# Patient Record
Sex: Female | Born: 1970 | Race: White | Hispanic: No | Marital: Single | Smoking: Never smoker
Health system: Southern US, Community
[De-identification: ages and names within clinical notes are randomized; demographics above are authoritative.]

## PROBLEM LIST (undated history)

## (undated) DIAGNOSIS — E119 Type 2 diabetes mellitus without complications: Secondary | ICD-10-CM

---

## 2020-04-13 ENCOUNTER — Emergency Department: Payer: Medicaid Other

## 2020-04-13 ENCOUNTER — Other Ambulatory Visit: Payer: Self-pay

## 2020-04-13 ENCOUNTER — Inpatient Hospital Stay: Payer: Medicaid Other

## 2020-04-13 ENCOUNTER — Inpatient Hospital Stay
Admission: EM | Admit: 2020-04-13 | Discharge: 2020-04-21 | DRG: 870 | Disposition: A | Discharge status: Home / Self Care | Payer: Medicaid Other | Attending: Internal Medicine | Admitting: Internal Medicine

## 2020-04-13 ENCOUNTER — Encounter: Payer: Self-pay | Admitting: Emergency Medicine

## 2020-04-13 DIAGNOSIS — J189 Pneumonia, unspecified organism: Secondary | ICD-10-CM | POA: Diagnosis present

## 2020-04-13 DIAGNOSIS — E872 Acidosis: Secondary | ICD-10-CM | POA: Diagnosis not present

## 2020-04-13 DIAGNOSIS — Z6841 Body Mass Index (BMI) 40.0 and over, adult: Secondary | ICD-10-CM | POA: Diagnosis not present

## 2020-04-13 DIAGNOSIS — R9389 Abnormal findings on diagnostic imaging of other specified body structures: Secondary | ICD-10-CM

## 2020-04-13 DIAGNOSIS — J9602 Acute respiratory failure with hypercapnia: Secondary | ICD-10-CM | POA: Diagnosis present

## 2020-04-13 DIAGNOSIS — Z452 Encounter for adjustment and management of vascular access device: Secondary | ICD-10-CM

## 2020-04-13 DIAGNOSIS — F329 Major depressive disorder, single episode, unspecified: Secondary | ICD-10-CM | POA: Diagnosis present

## 2020-04-13 DIAGNOSIS — E0811 Diabetes mellitus due to underlying condition with ketoacidosis with coma: Secondary | ICD-10-CM

## 2020-04-13 DIAGNOSIS — G039 Meningitis, unspecified: Secondary | ICD-10-CM

## 2020-04-13 DIAGNOSIS — E1111 Type 2 diabetes mellitus with ketoacidosis with coma: Secondary | ICD-10-CM | POA: Diagnosis present

## 2020-04-13 DIAGNOSIS — R6521 Severe sepsis with septic shock: Secondary | ICD-10-CM | POA: Diagnosis not present

## 2020-04-13 DIAGNOSIS — Z23 Encounter for immunization: Secondary | ICD-10-CM | POA: Diagnosis not present

## 2020-04-13 DIAGNOSIS — A419 Sepsis, unspecified organism: Secondary | ICD-10-CM

## 2020-04-13 DIAGNOSIS — Z20822 Contact with and (suspected) exposure to covid-19: Secondary | ICD-10-CM | POA: Diagnosis present

## 2020-04-13 DIAGNOSIS — G43909 Migraine, unspecified, not intractable, without status migrainosus: Secondary | ICD-10-CM | POA: Diagnosis present

## 2020-04-13 DIAGNOSIS — E662 Morbid (severe) obesity with alveolar hypoventilation: Secondary | ICD-10-CM | POA: Diagnosis present

## 2020-04-13 DIAGNOSIS — N179 Acute kidney failure, unspecified: Secondary | ICD-10-CM | POA: Diagnosis present

## 2020-04-13 DIAGNOSIS — E785 Hyperlipidemia, unspecified: Secondary | ICD-10-CM | POA: Diagnosis present

## 2020-04-13 DIAGNOSIS — J9601 Acute respiratory failure with hypoxia: Secondary | ICD-10-CM | POA: Diagnosis present

## 2020-04-13 DIAGNOSIS — I5021 Acute systolic (congestive) heart failure: Secondary | ICD-10-CM | POA: Diagnosis present

## 2020-04-13 DIAGNOSIS — G92 Toxic encephalopathy: Secondary | ICD-10-CM | POA: Diagnosis present

## 2020-04-13 HISTORY — DX: Type 2 diabetes mellitus without complications: E11.9

## 2020-04-13 LAB — SALICYLATE LEVEL: Salicylate Lvl: <7 mg/dL — ABNORMAL LOW (ref 7.0–30.0)

## 2020-04-13 LAB — URINE DRUG SCREEN, QUALITATIVE (ARMC ONLY)
Amphetamines, Ur Screen: NOT DETECTED
Barbiturates, Ur Screen: NOT DETECTED
Benzodiazepine, Ur Scrn: NOT DETECTED
Cannabinoid 50 Ng, Ur ~~LOC~~: NOT DETECTED
Cocaine Metabolite,Ur ~~LOC~~: NOT DETECTED
MDMA (Ecstasy)Ur Screen: NOT DETECTED
Methadone Scn, Ur: NOT DETECTED
Opiate, Ur Screen: NOT DETECTED
Phencyclidine (PCP) Ur S: NOT DETECTED
Tricyclic, Ur Screen: NOT DETECTED

## 2020-04-13 LAB — BLOOD GAS, ARTERIAL
Acid-base deficit: 10.2 mmol/L — ABNORMAL HIGH (ref 0.0–2.0)
Bicarbonate: 16.8 mmol/L — ABNORMAL LOW (ref 20.0–28.0)
FIO2: 0.3
MECHVT: 500 mL
O2 Saturation: 94.2 %
PEEP: 5 cmH2O
Patient temperature: 37.9
RATE: 16 resp/min
pCO2 arterial: 42 mmHg (ref 32.0–48.0)
pH, Arterial: 7.22 — ABNORMAL LOW (ref 7.350–7.450)
pO2, Arterial: 90 mmHg (ref 83.0–108.0)

## 2020-04-13 LAB — URINALYSIS, COMPLETE (UACMP) WITH MICROSCOPIC
Bacteria, UA: NONE SEEN
Bilirubin Urine: NEGATIVE
Glucose, UA: >=500 mg/dL — AB
Ketones, ur: 80 mg/dL — AB
Leukocytes,Ua: NEGATIVE
Nitrite: NEGATIVE
Protein, ur: >=300 mg/dL — AB
Specific Gravity, Urine: 1.024 (ref 1.005–1.030)
pH: 5 (ref 5.0–8.0)

## 2020-04-13 LAB — CBC WITH DIFFERENTIAL/PLATELET
Abs Immature Granulocytes: 0.13 10*3/uL — ABNORMAL HIGH (ref 0.00–0.07)
Basophils Absolute: 0.1 10*3/uL (ref 0.0–0.1)
Basophils Relative: 1 %
Eosinophils Absolute: 0 10*3/uL (ref 0.0–0.5)
Eosinophils Relative: 0 %
HCT: 38.6 % (ref 36.0–46.0)
Hemoglobin: 12.9 g/dL (ref 12.0–15.0)
Immature Granulocytes: 1 %
Lymphocytes Relative: 7 %
Lymphs Abs: 1.4 10*3/uL (ref 0.7–4.0)
MCH: 31.2 pg (ref 26.0–34.0)
MCHC: 33.4 g/dL (ref 30.0–36.0)
MCV: 93.2 fL (ref 80.0–100.0)
Monocytes Absolute: 0.8 10*3/uL (ref 0.1–1.0)
Monocytes Relative: 4 %
Neutro Abs: 18.2 10*3/uL — ABNORMAL HIGH (ref 1.7–7.7)
Neutrophils Relative %: 87 %
Platelets: 358 10*3/uL (ref 150–400)
RBC: 4.14 MIL/uL (ref 3.87–5.11)
RDW: 12.3 % (ref 11.5–15.5)
WBC: 20.7 10*3/uL — ABNORMAL HIGH (ref 4.0–10.5)
nRBC: 0 % (ref 0.0–0.2)

## 2020-04-13 LAB — COMPREHENSIVE METABOLIC PANEL
ALT: 17 U/L (ref 0–44)
AST: 19 U/L (ref 15–41)
Albumin: 3.4 g/dL — ABNORMAL LOW (ref 3.5–5.0)
Alkaline Phosphatase: 72 U/L (ref 38–126)
Anion gap: 20 — ABNORMAL HIGH (ref 5–15)
BUN: 32 mg/dL — ABNORMAL HIGH (ref 6–20)
CO2: 21 mmol/L — ABNORMAL LOW (ref 22–32)
Calcium: 8.8 mg/dL — ABNORMAL LOW (ref 8.9–10.3)
Chloride: 98 mmol/L (ref 98–111)
Creatinine, Ser: 1.43 mg/dL — ABNORMAL HIGH (ref 0.44–1.00)
GFR calc Af Amer: 50 mL/min — ABNORMAL LOW (ref >60)
GFR calc non Af Amer: 43 mL/min — ABNORMAL LOW (ref >60)
Glucose, Bld: 589 mg/dL (ref 70–99)
Potassium: 5.1 mmol/L (ref 3.5–5.1)
Sodium: 139 mmol/L (ref 135–145)
Total Bilirubin: 2.1 mg/dL — ABNORMAL HIGH (ref 0.3–1.2)
Total Protein: 7.1 g/dL (ref 6.5–8.1)

## 2020-04-13 LAB — BASIC METABOLIC PANEL
Anion gap: 12 (ref 5–15)
Anion gap: 7 (ref 5–15)
BUN: 31 mg/dL — ABNORMAL HIGH (ref 6–20)
BUN: 26 mg/dL — ABNORMAL HIGH (ref 6–20)
CO2: 20 mmol/L — ABNORMAL LOW (ref 22–32)
CO2: 21 mmol/L — ABNORMAL LOW (ref 22–32)
Calcium: 7.7 mg/dL — ABNORMAL LOW (ref 8.9–10.3)
Calcium: 7.3 mg/dL — ABNORMAL LOW (ref 8.9–10.3)
Chloride: 108 mmol/L (ref 98–111)
Chloride: 110 mmol/L (ref 98–111)
Creatinine, Ser: 1.4 mg/dL — ABNORMAL HIGH (ref 0.44–1.00)
Creatinine, Ser: 1.22 mg/dL — ABNORMAL HIGH (ref 0.44–1.00)
GFR calc Af Amer: 51 mL/min — ABNORMAL LOW (ref >60)
GFR calc Af Amer: >60 mL/min (ref >60)
GFR calc non Af Amer: 44 mL/min — ABNORMAL LOW (ref >60)
GFR calc non Af Amer: 52 mL/min — ABNORMAL LOW (ref >60)
Glucose, Bld: 209 mg/dL — ABNORMAL HIGH (ref 70–99)
Glucose, Bld: 797 mg/dL (ref 70–99)
Potassium: 3.8 mmol/L (ref 3.5–5.1)
Potassium: 4.5 mmol/L (ref 3.5–5.1)
Sodium: 140 mmol/L (ref 135–145)
Sodium: 138 mmol/L (ref 135–145)

## 2020-04-13 LAB — GLUCOSE, CAPILLARY
Glucose-Capillary: 560 mg/dL (ref 70–99)
Glucose-Capillary: 551 mg/dL (ref 70–99)
Glucose-Capillary: 476 mg/dL — ABNORMAL HIGH (ref 70–99)
Glucose-Capillary: 354 mg/dL — ABNORMAL HIGH (ref 70–99)
Glucose-Capillary: 440 mg/dL — ABNORMAL HIGH (ref 70–99)
Glucose-Capillary: 127 mg/dL — ABNORMAL HIGH (ref 70–99)
Glucose-Capillary: 149 mg/dL — ABNORMAL HIGH (ref 70–99)
Glucose-Capillary: 133 mg/dL — ABNORMAL HIGH (ref 70–99)
Glucose-Capillary: 157 mg/dL — ABNORMAL HIGH (ref 70–99)
Glucose-Capillary: 162 mg/dL — ABNORMAL HIGH (ref 70–99)
Glucose-Capillary: 151 mg/dL — ABNORMAL HIGH (ref 70–99)
Glucose-Capillary: 144 mg/dL — ABNORMAL HIGH (ref 70–99)
Glucose-Capillary: 148 mg/dL — ABNORMAL HIGH (ref 70–99)
Glucose-Capillary: 174 mg/dL — ABNORMAL HIGH (ref 70–99)

## 2020-04-13 LAB — BLOOD GAS, VENOUS
Acid-base deficit: 6.9 mmol/L — ABNORMAL HIGH (ref 0.0–2.0)
Bicarbonate: 20.6 mmol/L (ref 20.0–28.0)
O2 Saturation: 76.3 %
Patient temperature: 37
pCO2, Ven: 48 mmHg (ref 44.0–60.0)
pH, Ven: 7.24 — ABNORMAL LOW (ref 7.250–7.430)
pO2, Ven: 49 mmHg — ABNORMAL HIGH (ref 32.0–45.0)

## 2020-04-13 LAB — BETA-HYDROXYBUTYRIC ACID: Beta-Hydroxybutyric Acid: 6.27 mmol/L — ABNORMAL HIGH (ref 0.05–0.27)

## 2020-04-13 LAB — ACETAMINOPHEN LEVEL: Acetaminophen (Tylenol), Serum: <10 ug/mL — ABNORMAL LOW (ref 10–30)

## 2020-04-13 LAB — LIPASE, BLOOD: Lipase: 24 U/L (ref 11–51)

## 2020-04-13 LAB — PROCALCITONIN: Procalcitonin: <0.1 ng/mL

## 2020-04-13 LAB — SARS CORONAVIRUS 2 BY RT PCR (HOSPITAL ORDER, PERFORMED IN ~~LOC~~ HOSPITAL LAB): SARS Coronavirus 2: NEGATIVE

## 2020-04-13 LAB — LACTIC ACID, PLASMA
Lactic Acid, Venous: 3.2 mmol/L (ref 0.5–1.9)
Lactic Acid, Venous: 2.4 mmol/L (ref 0.5–1.9)

## 2020-04-13 LAB — PREGNANCY, URINE: Preg Test, Ur: NEGATIVE

## 2020-04-13 LAB — MRSA PCR SCREENING: MRSA by PCR: NEGATIVE

## 2020-04-13 LAB — ETHANOL: Alcohol, Ethyl (B): <10 mg/dL (ref <10)

## 2020-04-13 MED ORDER — DOCUSATE SODIUM 100 MG PO CAPS: 100.0000 mg | ORAL_CAPSULE | Freq: Two times a day (BID) | ORAL | Status: DC | PRN

## 2020-04-13 MED ORDER — KETAMINE HCL 10 MG/ML IJ SOLN
INTRAMUSCULAR | Status: AC | PRN
  Administered 2020-04-13: 200 mg via INTRAVENOUS

## 2020-04-13 MED ORDER — NOREPINEPHRINE 4 MG/250ML-% IV SOLN
2.0000 ug/min | INTRAVENOUS | Status: DC
  Administered 2020-04-14: 5 ug/min via INTRAVENOUS
  Filled 2020-04-13 (×3): qty 250

## 2020-04-13 MED ORDER — PROPOFOL BOLUS VIA INFUSION
50.0000 mg | Freq: Once | INTRAVENOUS | Status: AC
  Administered 2020-04-13: 50 mg via INTRAVENOUS

## 2020-04-13 MED ORDER — SODIUM CHLORIDE 0.9 % IV BOLUS
1000.0000 mL | Freq: Once | INTRAVENOUS | Status: AC
  Administered 2020-04-13: 1000 mL via INTRAVENOUS

## 2020-04-13 MED ORDER — POTASSIUM CHLORIDE 20 MEQ PO PACK
40.0000 meq | PACK | ORAL | Status: AC
  Administered 2020-04-13 (×2): 40 meq
  Filled 2020-04-13 (×2): qty 2

## 2020-04-13 MED ORDER — SODIUM CHLORIDE 0.9 % IV SOLN: INTRAVENOUS | Status: DC

## 2020-04-13 MED ORDER — PROPOFOL 1000 MG/100ML IV EMUL
5.0000 ug/kg/min | INTRAVENOUS | Status: DC
  Administered 2020-04-13: 20 ug/kg/min via INTRAVENOUS
  Administered 2020-04-13: 10 ug/kg/min via INTRAVENOUS
  Administered 2020-04-13: 60 ug/kg/min via INTRAVENOUS
  Administered 2020-04-14: 40 ug/kg/min via INTRAVENOUS
  Administered 2020-04-14: 25 ug/kg/min via INTRAVENOUS
  Administered 2020-04-14 (×2): 30 ug/kg/min via INTRAVENOUS
  Administered 2020-04-14 – 2020-04-15 (×2): 35 ug/kg/min via INTRAVENOUS
  Administered 2020-04-15: 30 ug/kg/min via INTRAVENOUS
  Filled 2020-04-13 (×9): qty 100

## 2020-04-13 MED ORDER — INSULIN REGULAR(HUMAN) IN NACL 100-0.9 UT/100ML-% IV SOLN
INTRAVENOUS | Status: DC
  Administered 2020-04-13: 15 [IU]/h via INTRAVENOUS
  Administered 2020-04-13: 2.2 [IU]/h via INTRAVENOUS
  Filled 2020-04-13 (×2): qty 100

## 2020-04-13 MED ORDER — SODIUM CHLORIDE 0.9 % IV SOLN
2.0000 g | Freq: Two times a day (BID) | INTRAVENOUS | Status: AC
  Administered 2020-04-13 – 2020-04-19 (×13): 2 g via INTRAVENOUS
  Filled 2020-04-13 (×2): qty 20
  Filled 2020-04-13: qty 2
  Filled 2020-04-13 (×2): qty 20
  Filled 2020-04-13 (×3): qty 2
  Filled 2020-04-13: qty 20
  Filled 2020-04-13 (×5): qty 2
  Filled 2020-04-13 (×2): qty 20
  Filled 2020-04-13: qty 2

## 2020-04-13 MED ORDER — LACTATED RINGERS IV BOLUS
1000.0000 mL | Freq: Once | INTRAVENOUS | Status: AC
  Administered 2020-04-13: 1000 mL via INTRAVENOUS

## 2020-04-13 MED ORDER — FENTANYL BOLUS VIA INFUSION
50.0000 ug | INTRAVENOUS | Status: DC | PRN
  Administered 2020-04-15 – 2020-04-17 (×4): 50 ug via INTRAVENOUS
  Filled 2020-04-13: qty 50

## 2020-04-13 MED ORDER — DOCUSATE SODIUM 50 MG/5ML PO LIQD
100.0000 mg | Freq: Two times a day (BID) | ORAL | Status: DC
  Administered 2020-04-13 – 2020-04-19 (×8): 100 mg
  Filled 2020-04-13 (×9): qty 10

## 2020-04-13 MED ORDER — DEXTROSE IN LACTATED RINGERS 5 % IV SOLN: INTRAVENOUS | Status: DC

## 2020-04-13 MED ORDER — HYDROMORPHONE HCL 1 MG/ML IJ SOLN
1.0000 mg | INTRAMUSCULAR | Status: AC
  Administered 2020-04-13: 1 mg via INTRAVENOUS
  Filled 2020-04-13: qty 1

## 2020-04-13 MED ORDER — POLYETHYLENE GLYCOL 3350 17 G PO PACK: 17.0000 g | PACK | Freq: Every day | ORAL | Status: DC | PRN

## 2020-04-13 MED ORDER — VANCOMYCIN HCL IN DEXTROSE 1-5 GM/200ML-% IV SOLN
1000.0000 mg | Freq: Once | INTRAVENOUS | Status: AC
  Administered 2020-04-13: 1000 mg via INTRAVENOUS
  Filled 2020-04-13: qty 200

## 2020-04-13 MED ORDER — ONDANSETRON HCL 4 MG/2ML IJ SOLN: 4.0000 mg | Freq: Once | INTRAMUSCULAR | Status: DC

## 2020-04-13 MED ORDER — FAMOTIDINE IN NACL 20-0.9 MG/50ML-% IV SOLN
20.0000 mg | Freq: Two times a day (BID) | INTRAVENOUS | Status: DC
  Administered 2020-04-13 – 2020-04-17 (×9): 20 mg via INTRAVENOUS
  Filled 2020-04-13 (×9): qty 50

## 2020-04-13 MED ORDER — LORAZEPAM 2 MG/ML IJ SOLN
INTRAMUSCULAR | Status: AC
  Administered 2020-04-13: 4 mg via INTRAVENOUS
  Filled 2020-04-13: qty 2

## 2020-04-13 MED ORDER — PROPOFOL 1000 MG/100ML IV EMUL
5.0000 ug/kg/min | INTRAVENOUS | Status: DC
  Administered 2020-04-13: 10 ug/kg/min via INTRAVENOUS

## 2020-04-13 MED ORDER — ROCURONIUM BROMIDE 50 MG/5ML IV SOLN
INTRAVENOUS | Status: AC | PRN
  Administered 2020-04-13: 100 mg via INTRAVENOUS

## 2020-04-13 MED ORDER — LACTATED RINGERS IV SOLN: INTRAVENOUS | Status: DC

## 2020-04-13 MED ORDER — DEXTROSE 50 % IV SOLN
0.0000 mL | INTRAVENOUS | Status: DC | PRN
  Administered 2020-04-19: 25 mL via INTRAVENOUS
  Filled 2020-04-13 (×2): qty 50

## 2020-04-13 MED ORDER — ACETAMINOPHEN 325 MG PO TABS: 650.0000 mg | ORAL_TABLET | ORAL | Status: DC | PRN

## 2020-04-13 MED ORDER — CHLORHEXIDINE GLUCONATE 0.12% ORAL RINSE (MEDLINE KIT)
15.0000 mL | Freq: Two times a day (BID) | OROMUCOSAL | Status: DC
  Administered 2020-04-13 – 2020-04-17 (×8): 15 mL via OROMUCOSAL

## 2020-04-13 MED ORDER — ORAL CARE MOUTH RINSE
15.0000 mL | OROMUCOSAL | Status: DC
  Administered 2020-04-13 – 2020-04-17 (×38): 15 mL via OROMUCOSAL

## 2020-04-13 MED ORDER — ONDANSETRON HCL 4 MG/2ML IJ SOLN: 4.0000 mg | Freq: Four times a day (QID) | INTRAMUSCULAR | Status: DC | PRN

## 2020-04-13 MED ORDER — DOCUSATE SODIUM 50 MG/5ML PO LIQD: 100.0000 mg | Freq: Two times a day (BID) | ORAL | Status: DC

## 2020-04-13 MED ORDER — FENTANYL 2500MCG IN NS 250ML (10MCG/ML) PREMIX INFUSION
0.0000 ug/h | INTRAVENOUS | Status: DC
  Administered 2020-04-13: 50 ug/h via INTRAVENOUS
  Administered 2020-04-14 – 2020-04-15 (×3): 200 ug/h via INTRAVENOUS
  Administered 2020-04-15: 150 ug/h via INTRAVENOUS
  Administered 2020-04-16: 175 ug/h via INTRAVENOUS
  Administered 2020-04-16: 300 ug/h via INTRAVENOUS
  Administered 2020-04-16: 325 ug/h via INTRAVENOUS
  Administered 2020-04-17: 350 ug/h via INTRAVENOUS
  Filled 2020-04-13 (×8): qty 250

## 2020-04-13 MED ORDER — PROPOFOL 1000 MG/100ML IV EMUL
INTRAVENOUS | Status: AC
  Filled 2020-04-13: qty 100

## 2020-04-13 MED ORDER — POLYETHYLENE GLYCOL 3350 17 G PO PACK
17.0000 g | PACK | Freq: Every day | ORAL | Status: DC
  Administered 2020-04-15 – 2020-04-17 (×3): 17 g
  Filled 2020-04-13 (×3): qty 1

## 2020-04-13 MED ORDER — SODIUM CHLORIDE 0.9% FLUSH
3.0000 mL | Freq: Two times a day (BID) | INTRAVENOUS | Status: DC
  Administered 2020-04-13 – 2020-04-21 (×15): 3 mL via INTRAVENOUS

## 2020-04-13 MED ORDER — ACETAMINOPHEN 325 MG PO TABS
650.0000 mg | ORAL_TABLET | ORAL | Status: DC | PRN
  Administered 2020-04-13 – 2020-04-19 (×2): 650 mg
  Filled 2020-04-13 (×2): qty 2

## 2020-04-13 MED ORDER — DEXTROSE 5 % IV SOLN
10.0000 mg/kg | Freq: Once | INTRAVENOUS | Status: AC
  Administered 2020-04-14: 825 mg via INTRAVENOUS
  Filled 2020-04-13: qty 16.5

## 2020-04-13 MED ORDER — FENTANYL CITRATE (PF) 100 MCG/2ML IJ SOLN: 50.0000 ug | Freq: Once | INTRAMUSCULAR | Status: DC

## 2020-04-13 MED ORDER — CHLORHEXIDINE GLUCONATE CLOTH 2 % EX PADS
6.0000 | MEDICATED_PAD | Freq: Every day | CUTANEOUS | Status: DC
  Administered 2020-04-14 – 2020-04-19 (×6): 6 via TOPICAL

## 2020-04-13 MED ORDER — SODIUM CHLORIDE 0.9 % IV SOLN
250.0000 mL | INTRAVENOUS | Status: DC | PRN
  Administered 2020-04-13 – 2020-04-20 (×5): 250 mL via INTRAVENOUS

## 2020-04-13 MED ORDER — DEXTROSE-NACL 5-0.45 % IV SOLN: INTRAVENOUS | Status: DC

## 2020-04-13 MED ORDER — SODIUM CHLORIDE 0.9% FLUSH: 3.0000 mL | INTRAVENOUS | Status: DC | PRN

## 2020-04-13 MED ORDER — VANCOMYCIN HCL 10 G IV SOLR
2000.0000 mg | Freq: Once | INTRAVENOUS | Status: AC
  Administered 2020-04-14: 2000 mg via INTRAVENOUS
  Filled 2020-04-13: qty 2000

## 2020-04-13 MED ORDER — SODIUM CHLORIDE 0.9 % IV SOLN
2.0000 g | Freq: Once | INTRAVENOUS | Status: AC
  Administered 2020-04-13: 2 g via INTRAVENOUS
  Filled 2020-04-13: qty 2

## 2020-04-13 MED ORDER — MIDAZOLAM HCL 2 MG/2ML IJ SOLN
2.0000 mg | INTRAMUSCULAR | Status: AC | PRN
  Administered 2020-04-15 – 2020-04-16 (×2): 2 mg via INTRAVENOUS
  Filled 2020-04-13 (×9): qty 2

## 2020-04-13 MED ORDER — FENTANYL 2500MCG IN NS 250ML (10MCG/ML) PREMIX INFUSION
0.0000 ug/h | INTRAVENOUS | Status: DC
  Filled 2020-04-13: qty 250

## 2020-04-13 MED ORDER — DEXAMETHASONE SODIUM PHOSPHATE 10 MG/ML IJ SOLN
15.0000 mg | Freq: Four times a day (QID) | INTRAMUSCULAR | Status: AC
  Administered 2020-04-13 – 2020-04-15 (×8): 15 mg via INTRAVENOUS
  Filled 2020-04-13 (×9): qty 1.5

## 2020-04-13 MED ORDER — POLYETHYLENE GLYCOL 3350 17 G PO PACK: 17.0000 g | PACK | Freq: Every day | ORAL | Status: DC

## 2020-04-13 MED ORDER — LORAZEPAM 2 MG/ML IJ SOLN
4.0000 mg | Freq: Once | INTRAMUSCULAR | Status: DC
  Filled 2020-04-13: qty 2

## 2020-04-13 MED ORDER — LACTATED RINGERS IV BOLUS
2000.0000 mL | Freq: Once | INTRAVENOUS | Status: AC
  Administered 2020-04-13: 2000 mL via INTRAVENOUS

## 2020-04-13 MED ORDER — KETAMINE HCL 10 MG/ML IJ SOLN
0.3000 mg/kg | Freq: Once | INTRAMUSCULAR | Status: AC
  Administered 2020-04-13: 35 mg via INTRAVENOUS

## 2020-04-13 MED ORDER — MIDAZOLAM HCL 2 MG/2ML IJ SOLN
2.0000 mg | INTRAMUSCULAR | Status: DC | PRN
  Administered 2020-04-15 – 2020-04-16 (×8): 2 mg via INTRAVENOUS
  Filled 2020-04-13 (×5): qty 2

## 2020-04-13 MED ORDER — LORAZEPAM 2 MG/ML IJ SOLN: 4.0000 mg | Freq: Once | INTRAMUSCULAR | Status: AC

## 2020-04-13 MED ORDER — MIDAZOLAM HCL 2 MG/2ML IJ SOLN
INTRAMUSCULAR | Status: AC
  Administered 2020-04-13: 2 mg via INTRAVENOUS
  Filled 2020-04-13: qty 2

## 2020-04-13 MED ORDER — ENOXAPARIN SODIUM 40 MG/0.4ML ~~LOC~~ SOLN
40.0000 mg | Freq: Every day | SUBCUTANEOUS | Status: DC
  Administered 2020-04-13: 40 mg via SUBCUTANEOUS
  Filled 2020-04-13: qty 0.4

## 2020-04-13 MED ORDER — KETAMINE HCL 10 MG/ML IJ SOLN
INTRAMUSCULAR | Status: AC
  Filled 2020-04-13: qty 1

## 2020-04-13 MED ORDER — FAMOTIDINE IN NACL 20-0.9 MG/50ML-% IV SOLN: 20.0000 mg | Freq: Two times a day (BID) | INTRAVENOUS | Status: DC

## 2020-04-13 MED ORDER — ENOXAPARIN SODIUM 40 MG/0.4ML ~~LOC~~ SOLN: 40.0000 mg | SUBCUTANEOUS | Status: DC

## 2020-04-13 MED ORDER — METRONIDAZOLE IN NACL 5-0.79 MG/ML-% IV SOLN
500.0000 mg | Freq: Once | INTRAVENOUS | Status: AC
  Administered 2020-04-13: 500 mg via INTRAVENOUS
  Filled 2020-04-13: qty 100

## 2020-04-13 MED ORDER — SODIUM CHLORIDE 0.9 % IV SOLN
250.0000 mL | INTRAVENOUS | Status: DC
  Administered 2020-04-18 – 2020-04-19 (×3): 250 mL via INTRAVENOUS

## 2020-04-13 NOTE — Procedures (Signed)
ELECTROENCEPHALOGRAM REPORT   Patient: Jessica Santiago       Room #: IC14A-AA EEG No. ID: [id number] Age: 49 y.o.        Sex: female Requesting Physician: Kasa Report Date:  04/13/2020        Interpreting Physician: Thana Farr  History: [name] is an 49 y.o. female with DKA and AMS  Medications:  Fentanyl, Propofol, Insulin  Conditions of Recording:  This is a 21 channel routine scalp EEG performed with bipolar and monopolar montages arranged in accordance to the international 10/20 system of electrode placement. One channel was dedicated to EKG recording.  The patient is in the intubated and sedated state.  Patient is positioned with her head to the left.  Description: Artifact is prominent during the recording.  The background activity is discontinuous. It consists of bursts of polyspike, spike and slow wave activity alternating with periods of attenuation. The burst activity lasts up to 3 seconds and is generalized for the most part although there are some occasions when this burst activity is more prominent from the left hemisphere.  The periods of attenuation last up to 12 seconds. This discontinuous activity is maintained throughout the tracing.  No activation procedures were performed   IMPRESSION: This is an abnormal EEG due to a burst-suppression pattern seen throughout the tracing.  Burst suppression pattern can be seen in a variety of circumstances, including anesthesia, drug intoxication, hypothermia, as well as cerebral anoxia.  Clinical/neurological, and radiographic correlation advised.     Thana Farr, MD Neurology [phone] 04/13/2020, 3:19 PM

## 2020-04-13 NOTE — Progress Notes (Signed)
STAT eeg completed

## 2020-04-13 NOTE — ED Notes (Signed)
2 sets of blood cultures sent to lab.

## 2020-04-13 NOTE — Progress Notes (Signed)
BP in 90s. Another bolus ordered and started.

## 2020-04-13 NOTE — ED Triage Notes (Signed)
Pt presents from home via [facility] c/o altered mental status. pt blood sugar reading high initially after fluid 480. boyfriend found pt on floor covered in vomit and feces, pt aggresive with ems. Pt has hx of diabtes. Pt currently agitated and non-verbal. Pt maintaining airway at this time.

## 2020-04-13 NOTE — ED Notes (Signed)
ET tube 21 cm at lip, positive color change, positive breath sounds bilaterally.

## 2020-04-13 NOTE — H&P (Addendum)
Name: Jessica Santiago MRN: [id number] DOB: 1971/09/16     CONSULTATION DATE: 04/13/2020  REFERRING MD :  Scotty Court  CHIEF COMPLAINT:  UNREPSONSIVENSS  HISTORY OF PRESENT ILLNESS:    49 y.o. female with a history of diabetes who is brought to the ED due to altered mental status.    She was found on the floor at home covered in feces urine and vomit by her boyfriend.    EMS report the patient is confused, agitated, unable to provide history or engage in medical care.  Blood sugar on scene read as "high."  Patient with acute resp failure Acute toxic metabolic encephalopathy Intubated for severe resp failure  Patient arrived to ICU with severe resp failure and postural decortication Patient already on Propofol and 4 Ativan given  Patient is critically ill and multiorgan failure    PAST MEDICAL HISTORY :   has a past medical history of Diabetes mellitus without complication (HCC).  has no past surgical history on file. Prior to Admission medications   Not on File   Not on File  FAMILY HISTORY:  family history is not on file. SOCIAL HISTORY:    REVIEW OF SYSTEMS:   Unable to obtain due to critical illness      There is no height or weight on file to calculate BMI.    VITAL SIGNS: Temp:  [95.5 F (35.3 C)-100.3 F (37.9 C)] 99.7 F (37.6 C) (05/13 1200) Pulse Rate:  [130-152] 133 (05/13 1200) Resp:  [17-26] 17 (05/13 1115) BP: (110-160)/(59-100) 122/79 (05/13 1200) SpO2:  [97 %-100 %] 99 % (05/13 1200) FiO2 (%):  [30 %] 30 % (05/13 1029) Weight:  [115.2 kg] 115.2 kg (05/13 0932)   No intake/output data recorded. No intake/output data recorded.   SpO2: 99 % FiO2 (%): 30 %   Physical Examination:  GENERAL:critically ill appearing, +resp distress HEAD: Normocephalic, atraumatic.  EYES: Pupils equal, round, reactive to light.  No scleral icterus.  MOUTH: Moist mucosal membrane. NECK: Supple. No JVD.  PULMONARY: +rhonchi, +wheezing CARDIOVASCULAR:  S1 and S2. Regular rate and rhythm. No murmurs, rubs, or gallops.  GASTROINTESTINAL: Soft, nontender, -distended.  Positive bowel sounds.  MUSCULOSKELETAL: No swelling, clubbing, or edema.  NEUROLOGIC: obtunded SKIN:intact,warm,dry  I personally reviewed lab work that was obtained in last 24 hrs. CXR Independently reviewed-no acute pneumonia  MEDICATIONS: I have reviewed all medications and confirmed regimen as documented   CULTURE RESULTS   Recent Results (from the past 240 hour(s))  SARS Coronavirus 2 by RT PCR (hospital order, performed in [facility] hospital lab) Nasopharyngeal Nasopharyngeal Swab     Status: None   Collection Time: 04/13/20  9:17 AM   Specimen: Nasopharyngeal Swab  Result Value Ref Range Status   SARS Coronavirus 2 NEGATIVE NEGATIVE Final    Comment: (NOTE) SARS-CoV-2 target nucleic acids are NOT DETECTED. The SARS-CoV-2 RNA is generally detectable in upper and lower respiratory specimens during the acute phase of infection. The lowest concentration of SARS-CoV-2 viral copies this assay can detect is 250 copies / mL. A negative result does not preclude SARS-CoV-2 infection and should not be used as the sole basis for treatment or other patient management decisions.  A negative result may occur with improper specimen collection / handling, submission of specimen other than nasopharyngeal swab, presence of viral mutation(s) within the areas targeted by this assay, and inadequate number of viral copies (<250 copies / mL). A negative result must be combined with clinical observations, patient history, and epidemiological  information. Fact Sheet for Patients:   StrictlyIdeas.no Fact Sheet for Healthcare Providers: BankingDealers.co.za This test is not yet approved or cleared  by the [place] FDA and has been authorized for detection and/or diagnosis of SARS-CoV-2 by FDA under an Emergency Use Authorization  (EUA).  This EUA will remain in effect (meaning this test can be used) for the duration of the COVID-19 declaration under Section 564(b)(1) of the Act, 21 U.S.C. section 360bbb-3(b)(1), unless the authorization is terminated or revoked sooner. Performed at [facility], [place], [place] [address]           IMAGING    DG Abdomen 1 View  Result Date: 04/13/2020 CLINICAL DATA:  Orogastric tube placement EXAM: ABDOMEN - 1 VIEW COMPARISON:  None. FINDINGS: Orogastric tube tip and side port in stomach. Moderate stool in colon. No bowel dilatation or air-fluid level to suggest bowel obstruction. No free air. IMPRESSION: Orogastric tube tip and side port in stomach. No bowel obstruction or free air evident on supine examination. Electronically Signed   By: Lowella Grip III M.D.   On: 04/13/2020 11:12   DG Chest Portable 1 View  Result Date: 04/13/2020 CLINICAL DATA:  Hypoxia and confusion EXAM: PORTABLE CHEST 1 VIEW COMPARISON:  None. FINDINGS: Endotracheal tube tip is 3.2 cm above the carina. Nasogastric tube tip and side port are below the diaphragm. Side port is seen in the stomach. No pneumothorax. There is left base atelectasis. Lungs elsewhere clear. Heart is slightly enlarged with pulmonary vascularity normal. No adenopathy. No bone lesions. IMPRESSION: Tube positions as described without pneumothorax. Left base atelectasis. Lungs elsewhere clear. Heart prominent. Electronically Signed   By: Lowella Grip III M.D.   On: 04/13/2020 11:10     Nutrition Status:           Indwelling Urinary Catheter continued, requirement due to   Reason to continue Indwelling Urinary Catheter strict Intake/Output monitoring for hemodynamic instability         Ventilator continued, requirement due to severe respiratory failure   Ventilator Sedation RASS 0 to -2      ASSESSMENT AND PLAN SYNOPSIS   Severe ACUTE Hypoxic and Hypercapnic Respiratory Failure  due severe acidosis due to DKA with toxic metabolic encephalopathy -continue Full MV support -continue Bronchodilator Therapy -Wean Fio2 and PEEP as tolerated -VAP/VENT bundle implementation   Morbid obesity, possible OSA.   Will certainly impact respiratory mechanics, ventilator weaning Suspect will need to consider additional PEEP   ACUTE KIDNEY INJURY/Renal Failure -continue Foley Catheter-assess need -Avoid nephrotoxic agents -Follow urine output, BMP -Ensure adequate renal perfusion, optimize oxygenation -Renal dose medications     NEUROLOGY-assess for CVA and Seziures - intubated and sedated - minimal sedation to achieve a RASS goal: -1 Obtain CT head STAT Obtain EEG Neurology consultation pending  Acute toxic metabolic encephalopathy, need for sedation Goal RASS -2 to -3   SHOCK-HYPOVOLUMIC -use vasopressors to keep MAP>65 -follow ABG and LA -follow up cultures -aggressive IV fluid resuscitation  CARDIAC ICU monitoring   GI GI PROPHYLAXIS as indicated  NUTRITIONAL STATUS DIET-->NPO Constipation protocol as indicated   ENDO - will use ICU hypoglycemic\Hyperglycemia protocol if needed    ELECTROLYTES -follow labs as needed -replace as needed -pharmacy consultation and following    DVT/GI PRX ordered and assessed TRANSFUSIONS AS NEEDED MONITOR FSBS I Assessed the need for Labs I Assessed the need for Foley I Assessed the need for Central Venous Line Family Discussion when available I Assessed the  need for Mobilization I made an Assessment of medications to be adjusted accordingly Safety Risk assessment Completed  CASE DISCUSSED IN MULTIDISCIPLINARY ROUNDS WITH ICU TEAM   Critical Care Time devoted to patient care services described in this note is 45 minutes.   Overall, patient is critically ill, prognosis is guarded.  Patient with Multiorgan failure and at high risk for cardiac arrest and death.    Lucie Leather, M.D.  [facility]  Pulmonary & Critical Care Medicine  Medical Director [facility] Medical Director [facility] Cardio-Pulmonary Department

## 2020-04-13 NOTE — Progress Notes (Signed)
CODE SEPSIS - PHARMACY COMMUNICATION  **Broad Spectrum Antibiotics should be administered within 1 hour of Sepsis diagnosis**  Time Code Sepsis Called/Page Received: 0768  Antibiotics Ordered: vancomycin/cefepime/metronidazole  Time of 1st antibiotic administration: 0958  Additional action taken by pharmacy: NA  If necessary, Name of Provider/Nurse Contacted: NA   Pricilla Riffle ,PharmD Clinical Pharmacist  04/13/2020  10:38 AM

## 2020-04-13 NOTE — ED Notes (Signed)
Pt remains agitated and restless in bed. Pt attempting to get out of bed. Pt non-verbal still. MD Scotty Court at bedside preparing to intubate.

## 2020-04-13 NOTE — ED Provider Notes (Addendum)
[facility] Emergency Department Provider Note  ____________________________________________  Time seen: Approximately 10:52 AM  I have reviewed the triage vital signs and the nursing notes.   HISTORY  Chief Complaint Altered Mental Status    Level 5 Caveat: Portions of the History and Physical including HPI and review of systems are unable to be completely obtained due to patient being a poor historian   HPI Jessica Santiago is a 49 y.o. female with a history of diabetes who is brought to the ED due to altered mental status.  She was found on the floor at home covered in feces urine and vomit by her boyfriend.  EMS report the patient is confused, agitated, unable to provide history or engage in medical care.  Blood sugar on scene read as "high."      Past Medical History:  Diagnosis Date  . Diabetes mellitus without complication [facility])      Patient Active Problem List   Diagnosis Date Noted  . Acute respiratory failure with hypoxia (HCC) 04/13/2020     History reviewed. No pertinent surgical history.   Prior to Admission medications   Not on File     Allergies Patient has no allergy information on record.   History reviewed. No pertinent family history.  Social History Social History   Tobacco Use  . Smoking status: Unknown If Ever Smoked  Substance Use Topics  . Alcohol use: Not on file  . Drug use: Not on file    Review of Systems Level 5 Caveat: Portions of the History and Physical including HPI and review of systems are unable to be completely obtained due to patient being a poor historian   Constitutional:   No known fever.  ENT:   No rhinorrhea. Cardiovascular:   No chest pain or syncope. Respiratory:   No dyspnea or cough. Gastrointestinal:   Negative for abdominal pain, positive vomiting Musculoskeletal:   Negative for focal pain or swelling ____________________________________________   PHYSICAL EXAM:  VITAL  SIGNS: ED Triage Vitals  Enc Vitals Group     BP 04/13/20 0932 (!) 146/90     Pulse Rate 04/13/20 0921 (!) 141     Resp 04/13/20 0921 (!) 26     Temp 04/13/20 0921 (!) 95.5 F (35.3 C)     Temp Source 04/13/20 0921 Rectal     SpO2 04/13/20 0921 100 %     Weight 04/13/20 0932 253 lb 15.5 oz (115.2 kg)     Height --      Head Circumference --      Peak Flow --      Pain Score --      Pain Loc --      Pain Edu? --      Excl. in GC? --     Vital signs reviewed, nursing assessments reviewed.   Constitutional: Not alert.  Stuporous.  Ill-appearing.  Morbidly obese Eyes:   Conjunctivae are normal. EOM untestable. PERRL. ENT      Head:   Normocephalic and atraumatic.      Nose:   No congestion/rhinnorhea.       Mouth/Throat:   Dry mucous membranes, no pharyngeal erythema. No peritonsillar mass.       Neck:   No meningismus. Full ROM. Hematological/Lymphatic/Immunilogical:   No cervical lymphadenopathy. Cardiovascular:   Tachycardia heart rate 140. Symmetric bilateral radial and DP pulses.  No murmurs. Cap refill less than 2 seconds. Respiratory:   Tachypnea.  Symmetric air entry, no focal crackles  or wheezes. Gastrointestinal:   Soft and nontender. Non distended. There is no CVA tenderness.  No rebound, rigidity, or guarding. Musculoskeletal:   Normal range of motion in all extremities. No joint effusions.  No lower extremity tenderness.  No edema. Neurologic:   Nonverbal, stuporous, agitated..  Motor grossly intact. GCS = E2V2M4 = 8 Skin:    Skin is warm, dry and intact. No rash noted.  No petechiae, purpura, or bullae.  ____________________________________________    LABS (pertinent positives/negatives) (all labs ordered are listed, but only abnormal results are displayed) Labs Reviewed  COMPREHENSIVE METABOLIC PANEL - Abnormal; Notable for the following components:      Result Value   CO2 21 (*)    Glucose, Bld 589 (*)    BUN 32 (*)    Creatinine, Ser 1.43 (*)     Calcium 8.8 (*)    Albumin 3.4 (*)    Total Bilirubin 2.1 (*)    GFR calc non Af Amer 43 (*)    GFR calc Af Amer 50 (*)    Anion gap 20 (*)    All other components within normal limits  LACTIC ACID, PLASMA - Abnormal; Notable for the following components:   Lactic Acid, Venous 3.2 (*)    All other components within normal limits  CBC WITH DIFFERENTIAL/PLATELET - Abnormal; Notable for the following components:   WBC 20.7 (*)    Neutro Abs 18.2 (*)    Abs Immature Granulocytes 0.13 (*)    All other components within normal limits  BLOOD GAS, VENOUS - Abnormal; Notable for the following components:   pH, Ven 7.24 (*)    pO2, Ven 49.0 (*)    Acid-base deficit 6.9 (*)    All other components within normal limits  BETA-HYDROXYBUTYRIC ACID - Abnormal; Notable for the following components:   Beta-Hydroxybutyric Acid >8.00 (*)    All other components within normal limits  URINALYSIS, COMPLETE (UACMP) WITH MICROSCOPIC - Abnormal; Notable for the following components:   Color, Urine STRAW (*)    APPearance CLEAR (*)    Glucose, UA >=500 (*)    Hgb urine dipstick SMALL (*)    Ketones, ur 80 (*)    Protein, ur >=300 (*)    All other components within normal limits  GLUCOSE, CAPILLARY - Abnormal; Notable for the following components:   Glucose-Capillary 560 (*)    All other components within normal limits  BLOOD GAS, ARTERIAL - Abnormal; Notable for the following components:   pH, Arterial 7.22 (*)    Bicarbonate 16.8 (*)    Acid-base deficit 10.2 (*)    All other components within normal limits  GLUCOSE, CAPILLARY - Abnormal; Notable for the following components:   Glucose-Capillary 551 (*)    All other components within normal limits  SARS CORONAVIRUS 2 BY RT PCR (HOSPITAL ORDER, PERFORMED IN [facility])  URINE CULTURE  CULTURE, BLOOD (ROUTINE X 2)  CULTURE, BLOOD (ROUTINE X 2)  ETHANOL  LIPASE, BLOOD  URINE DRUG SCREEN, QUALITATIVE ([facility] ONLY)  LACTIC ACID, PLASMA   PROCALCITONIN  ACETAMINOPHEN LEVEL  SALICYLATE LEVEL  HIV ANTIBODY (ROUTINE TESTING W REFLEX)  PREGNANCY, URINE   ____________________________________________   EKG  Interpreted by me Sinus tachycardia rate 142.  Normal axis and intervals.  Normal QRS ST segments and T waves.  ____________________________________________    RADIOLOGY  DG Chest Portable 1 View  Result Date: 04/13/2020 CLINICAL DATA:  Hypoxia and confusion EXAM: PORTABLE CHEST 1 VIEW COMPARISON:  None. FINDINGS:  Endotracheal tube tip is 3.2 cm above the carina. Nasogastric tube tip and side port are below the diaphragm. Side port is seen in the stomach. No pneumothorax. There is left base atelectasis. Lungs elsewhere clear. Heart is slightly enlarged with pulmonary vascularity normal. No adenopathy. No bone lesions. IMPRESSION: Tube positions as described without pneumothorax. Left base atelectasis. Lungs elsewhere clear. Heart prominent. Electronically Signed   By: Lowella Grip III M.D.   On: 04/13/2020 11:10    ____________________________________________   PROCEDURES .Critical Care Performed by: Carrie Mew, MD Authorized by: Carrie Mew, MD   Critical care provider statement:    Critical care time (minutes):  40   Critical care time was exclusive of:  Separately billable procedures and treating other patients   Critical care was necessary to treat or prevent imminent or life-threatening deterioration of the following conditions:  CNS failure or compromise, sepsis, metabolic crisis, endocrine crisis and dehydration   Critical care was time spent personally by me on the following activities:  Development of treatment plan with patient or surrogate, discussions with consultants, evaluation of patient's response to treatment, examination of patient, obtaining history from patient or surrogate, ordering and performing treatments and interventions, ordering and review of laboratory studies, ordering  and review of radiographic studies, pulse oximetry, re-evaluation of patient's condition and review of old charts Comments:         Procedure Name: Intubation Date/Time: 04/13/2020 10:59 AM Performed by: Carrie Mew, MD Pre-anesthesia Checklist: Patient identified, Patient being monitored, Emergency Drugs available, Timeout performed and Suction available Oxygen Delivery Method: Non-rebreather mask Preoxygenation: Pre-oxygenation with 100% oxygen Induction Type: Rapid sequence Ventilation: Mask ventilation without difficulty Laryngoscope Size: Glidescope and 3 Grade View: Grade I Tube size: 7.5 mm Number of attempts: 1 Placement Confirmation: ETT inserted through vocal cords under direct vision,  CO2 detector and Breath sounds checked- equal and bilateral Secured at: 21 cm Tube secured with: ETT holder Dental Injury: Teeth and Oropharynx as per pre-operative assessment  Comments: Intubated with HOB elevated 30 degrees due to obesity and decreased pulm. Reserve.       Marland Kitchen1-3 Lead EKG Interpretation Performed by: Carrie Mew, MD Authorized by: Carrie Mew, MD     Interpretation: abnormal     ECG rate:  141   ECG rate assessment: tachycardic     Rhythm: sinus tachycardia     Ectopy: none     Conduction: normal      ____________________________________________  DIFFERENTIAL DIAGNOSIS   DKA with coma, electrolyte abnormality, dehydration, pneumonia, UTI, sepsis, intoxication  CLINICAL IMPRESSION / ASSESSMENT AND PLAN / ED COURSE  Medications ordered in the ED: Medications  ondansetron (ZOFRAN) injection 4 mg (0 mg Intravenous Hold 04/13/20 0936)  metroNIDAZOLE (FLAGYL) IVPB 500 mg (has no administration in time range)  vancomycin (VANCOCIN) IVPB 1000 mg/200 mL premix (1,000 mg Intravenous New Bag/Given 04/13/20 1041)  propofol (DIPRIVAN) 1000 MG/100ML infusion (10 mcg/kg/min  115.2 kg Intravenous New Bag/Given 04/13/20 1031)  insulin regular, human  (MYXREDLIN) 100 units/ 100 mL infusion (15 Units/hr Intravenous New Bag/Given 04/13/20 1111)  0.9 %  sodium chloride infusion (has no administration in time range)  dextrose 5 %-0.45 % sodium chloride infusion (has no administration in time range)  dextrose 50 % solution 0-50 mL (has no administration in time range)  sodium chloride flush (NS) 0.9 % injection 3 mL (3 mLs Intravenous Refused 04/13/20 1111)  sodium chloride flush (NS) 0.9 % injection 3 mL (has no administration in time range)  0.9 %  sodium chloride infusion (has no administration in time range)  acetaminophen (TYLENOL) tablet 650 mg (has no administration in time range)  docusate sodium (COLACE) capsule 100 mg (has no administration in time range)  polyethylene glycol (MIRALAX / GLYCOLAX) packet 17 g (has no administration in time range)  ondansetron (ZOFRAN) injection 4 mg (has no administration in time range)  famotidine (PEPCID) IVPB 20 mg premix (has no administration in time range)  enoxaparin (LOVENOX) injection 40 mg (has no administration in time range)  sodium chloride 0.9 % bolus 1,000 mL (0 mLs Intravenous Stopped 04/13/20 1010)  ketamine (KETALAR) injection 35 mg (35 mg Intravenous Given 04/13/20 0951)  ceFEPIme (MAXIPIME) 2 g in sodium chloride 0.9 % 100 mL IVPB (0 g Intravenous Stopped 04/13/20 1010)  sodium chloride 0.9 % bolus 1,000 mL (1,000 mLs Intravenous New Bag/Given 04/13/20 1040)  ketamine (KETALAR) injection (200 mg Intravenous Given 04/13/20 1016)  rocuronium (ZEMURON) injection (100 mg Intravenous Given 04/13/20 1017)  propofol (DIPRIVAN) bolus via infusion 50 mg (50 mg Intravenous Bolus from Bag 04/13/20 1030)  HYDROmorphone (DILAUDID) injection 1 mg (1 mg Intravenous Given 04/13/20 1102)    Pertinent labs & imaging results that were available during my care of the patient were reviewed by me and considered in my medical decision making (see chart for details).   Jessica Santiago was evaluated in Emergency  Department on 04/13/2020 for the symptoms described in the history of present illness. She was evaluated in the context of the global COVID-19 pandemic, which necessitated consideration that the patient might be at risk for infection with the SARS-CoV-2 virus that causes COVID-19. Institutional protocols and algorithms that pertain to the evaluation of patients at risk for COVID-19 are in a state of rapid change based on information released by regulatory bodies including the CDC and federal and state organizations. These policies and algorithms were followed during the patient's care in the ED.     Clinical Course as of Apr 13 1112  Thu Apr 13, 2020  [id number] Patient presents with confusion, tachycardia, tachypnea, severe hyperglycemia, concerning for DKA.  Initial presentation not consistent with sepsis, but with initial CBC showing leukocytosis of 20,000 9:17 AM, this raises suspicion for infectious process underlying the hyperglycemia.  Code sepsis initiated at that time, starting empiric antibiotics with cefepime vancomycin and Flagyl.  Patient given Ativan IV on arrival to calm her as her agitation was delaying care and preventing lab draw and blood culture draw.  She remained restless which I suspect is due to pain, and I ordered analgesic dose of ketamine.   [PS]  1025 Patient has persistent agitation, delirium, pulling at tubes, unable to be safe in the room.  As she is critically ill, this was interfering with treatment. She does not have MDM capacity. She was intubated for airway protection and to facilitate workup and management of her acute critical illness.   [PS]  1030 Lactate 3.2. no hypotension. No signs of septic shock.    [PS]  1044 Labs c/w DKA. Will start insulin  Beta-hydroxybutyric acid(!) [PS]  1112 D/w pt's son, Marlou StarksLuke Parrott, [phone]   [PS]    Clinical Course User Index [PS] Sharman CheekStafford, Gaines Cartmell, MD    ----------------------------------------- 11:02 AM on  04/13/2020 -----------------------------------------  Chemistry panel still pending.  Will admit to ICU.  Chest x-ray interpreted by me, shows appropriate position of endotracheal tube and orogastric tube.  Increased haziness of left lung, possible developing pneumonia.  No pneumothorax or subcu emphysema.   ____________________________________________  FINAL CLINICAL IMPRESSION(S) / ED DIAGNOSES    Final diagnoses:  Diabetic ketoacidosis with coma associated with type 2 diabetes mellitus (HCC)  Morbid obesity (HCC)  Severe sepsis [facility])     ED Discharge Orders    None      Portions of this note were generated with dragon dictation software. Dictation errors may occur despite best attempts at proofreading.   Sharman Cheek, MD 04/13/20 1107    Sharman Cheek, MD 04/13/20 1113

## 2020-04-13 NOTE — Progress Notes (Signed)
Pt arrived on the unit, unresponsive. Vent 30/5/16/500 Propofol on max. Temp 37.8 C  Seems to have posturing . HR in 130s, BP 96/68. MD notified. VO to give ativan 4 mg.  Bed in low position, alarms are on, continue to monitor

## 2020-04-13 NOTE — Progress Notes (Signed)
PHARMACY CONSULT NOTE  Pharmacy Consult for Electrolyte Monitoring and Replacement   Recent Labs: Potassium (mmol/L)  Date Value  04/13/2020 3.8   Calcium (mg/dL)  Date Value  79/44/4619 7.7 (L)   Albumin (g/dL)  Date Value  12/23/2409 3.4 (L)   Sodium (mmol/L)  Date Value  04/13/2020 140     Assessment: 49 year old female admitted with DKA, started on insulin drip. Patient also with respiratory failure requiring intubation. Pharmacy to manage electrolytes.  Goal of Therapy:  Electrolytes WNL, K > 4 while on insulin drip  Plan:  Potassium has decreased from 5.1 to 3.8 since start of insulin drip. Will give potassium 40 mEq via tube x 2 doses to maintain potassium. Will monitor closely while on insulin drip. Next check ~ 2100.  Pricilla Riffle ,PharmD Clinical Pharmacist 04/13/2020 3:28 PM

## 2020-04-13 NOTE — Progress Notes (Signed)
Another bolus, per MD. VS stable

## 2020-04-13 NOTE — Progress Notes (Signed)
PHARMACY CONSULT NOTE  Pharmacy Consult for Electrolyte Monitoring and Replacement   Recent Labs: Potassium (mmol/L)  Date Value  04/13/2020 5.1   Calcium (mg/dL)  Date Value  08/67/6195 8.8 (L)   Albumin (g/dL)  Date Value  09/32/6712 3.4 (L)   Sodium (mmol/L)  Date Value  04/13/2020 139     Assessment: 49 year old female admitted with DKA, started on insulin drip. Patient also with respiratory failure requiring intubation. Pharmacy to manage electrolytes.  Goal of Therapy:  Electrolytes WNL, K > 4 while on insulin drip  Plan:  No replacement indicated from morning BMP. Will check at 1500 and q6h thereafter. Will monitor closely while on insulin drip.  Pricilla Riffle ,PharmD Clinical Pharmacist 04/13/2020 2:08 PM

## 2020-04-13 NOTE — Progress Notes (Signed)
PHARMACY -  BRIEF ANTIBIOTIC NOTE   Pharmacy has received consult(s) for vancomycin and cefepime from an ED provider.  The patient's profile has been reviewed for ht/wt/allergies/indication/available labs.    One time order(s) placed for vanc 1 g + cefepime 2 g  Further antibiotics/pharmacy consults should be ordered by admitting physician if indicated.                       Thank you,  Pricilla Riffle, PharmD 04/13/2020  9:45 AM

## 2020-04-13 NOTE — Progress Notes (Signed)
Pt BP dropped to 70s. MD notified. 2L bolus LR ordered. Infusing. BP in 80s after 1st liter.Minimal UO. HR 101, 100/55 at this time (almost done with second bolus) MD is aware

## 2020-04-13 NOTE — ED Notes (Signed)
MD Stafford intubating at this time.

## 2020-04-13 NOTE — Consult Note (Signed)
Reason for Consult:AMS Requesting Physician: Kasa  CC: AMS  I have been asked by Dr. Mortimer Fries to see this patient in consultation for AMS and seizure like activity.  HPI: Jessica Santiago is an 49 y.o. female who is intubated and sedated therefore unable to provide any history.  All history obtained from the chart.  Patient with a history of diabetes and was brought to the ED due to altered mental status.  She was found on the floor at home covered in feces, urine and vomit by her boyfriend.  ON EMS arrival they reported the patient to be confused, agitated, unable to provide history or engage in medical care.  Blood sugar on scene read as "high." Initial blood sugar here of 589, found to be in DKA, ?sepsis.  Due to inability to protect her airway the patient was intubated and sedated.  Noted to have posturing, described as decerebrate.  4mg  of Ativan given.    Past Medical History:  Diagnosis Date  . Diabetes mellitus without complication [facility])     Surgical history: Unable to be obtained   Family history: Unable to be obtained  Social History:  has no history on file for tobacco, alcohol, and drug.  Not on File  Medications:  I have reviewed the patient's current medications. Prior to Admission:  Unable to obtain  Scheduled: . docusate  100 mg Per Tube BID  . enoxaparin (LOVENOX) injection  40 mg Subcutaneous Daily  . fentaNYL (SUBLIMAZE) injection  50 mcg Intravenous Once  . ondansetron (ZOFRAN) IV  4 mg Intravenous Once  . [START ON 04/14/2020] polyethylene glycol  17 g Per Tube Daily  . sodium chloride flush  3 mL Intravenous Q12H    ROS: Unable to obtain due to intubation, sedation  Physical Examination: Blood pressure 122/79, pulse (!) 133, temperature 99.7 F (37.6 C), resp. rate 17, weight 115.2 kg, SpO2 99 %.  HEENT-  Normocephalic, no lesions, without obvious abnormality.  Normal external eye and conjunctiva.  Normal TM's bilaterally.  Normal auditory canals and external  ears. Normal external nose, mucus membranes and septum.  Normal pharynx. Cardiovascular- S1, S2 normal, pulses palpable throughout   Lungs- chest clear, no wheezing, rales, normal symmetric air entry Abdomen- soft, non-tender; bowel sounds normal; no masses,  no organomegaly Extremities- no edema Lymph-no adenopathy palpable Musculoskeletal-no joint tenderness, deformity or swelling Skin-warm and dry, no hyperpigmentation, vitiligo, or suspicious lesions  Neurological Examination   Mental Status: Patient does not respond to verbal stimuli.  Does not respond to deep sternal rub.  Does not follow commands.  No verbalizations noted.  Cranial Nerves: II: patient does not respond confrontation bilaterally, pupils right 2 mm, left 2 mm,and unreactive bilaterally III,IV,VI: Oculocephalic response absent bilaterally.  V,VII: corneal reflex absent bilaterally  VIII: patient does not respond to verbal stimuli IX,X: gag reflex reduced, XI: trapezius strength unable to test bilaterally XII: tongue strength unable to test Motor: Extremities flaccid throughout.  Some decerebrate posturing noted at times Sensory: Does not respond to noxious stimuli in any extremity. Deep Tendon Reflexes:  Absent throughout. Plantars: Mute bilaterally Cerebellar: Unable to perform   Laboratory Studies:   Basic Metabolic Panel: Recent Labs  Lab 04/13/20 0917  NA 139  K 5.1  CL 98  CO2 21*  GLUCOSE 589*  BUN 32*  CREATININE 1.43*  CALCIUM 8.8*    Liver Function Tests: Recent Labs  Lab 04/13/20 0917  AST 19  ALT 17  ALKPHOS 72  BILITOT 2.1*  PROT  7.1  ALBUMIN 3.4*   Recent Labs  Lab 04/13/20 0917  LIPASE 24   No results for input(s): AMMONIA in the last 168 hours.  CBC: Recent Labs  Lab 04/13/20 0917  WBC 20.7*  NEUTROABS 18.2*  HGB 12.9  HCT 38.6  MCV 93.2  PLT 358    Cardiac Enzymes: No results for input(s): CKTOTAL, CKMB, CKMBINDEX, TROPONINI in the last 168  hours.  BNP: Invalid input(s): POCBNP  CBG: Recent Labs  Lab 04/13/20 0924 04/13/20 1055 04/13/20 1146 04/13/20 1230  GLUCAP 560* 551* 476* 440*    Microbiology: Results for orders placed or performed during the hospital encounter of 04/13/20  MRSA PCR Screening     Status: None   Collection Time: 04/13/20  9:14 AM   Specimen: Nasopharyngeal  Result Value Ref Range Status   MRSA by PCR NEGATIVE NEGATIVE Final    Comment:        The GeneXpert MRSA Assay (FDA approved for NASAL specimens only), is one component of a comprehensive MRSA colonization surveillance program. It is not intended to diagnose MRSA infection nor to guide or monitor treatment for MRSA infections. Performed at [facility], [address] [place], [place] [address]   SARS Coronavirus 2 by RT PCR (hospital order, performed in [facility] hospital lab) Nasopharyngeal Nasopharyngeal Swab     Status: None   Collection Time: 04/13/20  9:17 AM   Specimen: Nasopharyngeal Swab  Result Value Ref Range Status   SARS Coronavirus 2 NEGATIVE NEGATIVE Final    Comment: (NOTE) SARS-CoV-2 target nucleic acids are NOT DETECTED. The SARS-CoV-2 RNA is generally detectable in upper and lower respiratory specimens during the acute phase of infection. The lowest concentration of SARS-CoV-2 viral copies this assay can detect is 250 copies / mL. A negative result does not preclude SARS-CoV-2 infection and should not be used as the sole basis for treatment or other patient management decisions.  A negative result may occur with improper specimen collection / handling, submission of specimen other than nasopharyngeal swab, presence of viral mutation(s) within the areas targeted by this assay, and inadequate number of viral copies (<250 copies / mL). A negative result must be combined with clinical observations, patient history, and epidemiological information. Fact Sheet for Patients:    BoilerBrush.com.cy Fact Sheet for Healthcare Providers: https://pope.com/ This test is not yet approved or cleared  by the [place] FDA and has been authorized for detection and/or diagnosis of SARS-CoV-2 by FDA under an Emergency Use Authorization (EUA).  This EUA will remain in effect (meaning this test can be used) for the duration of the COVID-19 declaration under Section 564(b)(1) of the Act, 21 U.S.C. section 360bbb-3(b)(1), unless the authorization is terminated or revoked sooner. Performed at [facility], [address] [place], [place] [address]     Coagulation Studies: No results for input(s): LABPROT, INR in the last 72 hours.  Urinalysis:  Recent Labs  Lab 04/13/20 1027  COLORURINE STRAW*  LABSPEC 1.024  PHURINE 5.0  GLUCOSEU >=500*  HGBUR SMALL*  BILIRUBINUR NEGATIVE  KETONESUR 80*  PROTEINUR >=300*  NITRITE NEGATIVE  LEUKOCYTESUR NEGATIVE    Lipid Panel:  No results found for: CHOL, TRIG, HDL, CHOLHDL, VLDL, LDLCALC  HgbA1C: No results found for: HGBA1C  Urine Drug Screen:      Component Value Date/Time   LABOPIA NONE DETECTED 04/13/2020 1027   COCAINSCRNUR NONE DETECTED 04/13/2020 1027   LABBENZ NONE DETECTED 04/13/2020 1027   AMPHETMU NONE DETECTED 04/13/2020 1027  THCU NONE DETECTED 04/13/2020 1027   LABBARB NONE DETECTED 04/13/2020 1027    Alcohol Level:  Recent Labs  Lab 04/13/20 0917  ETH <10     Imaging: DG Abdomen 1 View  Result Date: 04/13/2020 CLINICAL DATA:  Orogastric tube placement EXAM: ABDOMEN - 1 VIEW COMPARISON:  None. FINDINGS: Orogastric tube tip and side port in stomach. Moderate stool in colon. No bowel dilatation or air-fluid level to suggest bowel obstruction. No free air. IMPRESSION: Orogastric tube tip and side port in stomach. No bowel obstruction or free air evident on supine examination. Electronically Signed   By: Bretta Bang III M.D.   On:  04/13/2020 11:12   DG Chest Portable 1 View  Result Date: 04/13/2020 CLINICAL DATA:  Hypoxia and confusion EXAM: PORTABLE CHEST 1 VIEW COMPARISON:  None. FINDINGS: Endotracheal tube tip is 3.2 cm above the carina. Nasogastric tube tip and side port are below the diaphragm. Side port is seen in the stomach. No pneumothorax. There is left base atelectasis. Lungs elsewhere clear. Heart is slightly enlarged with pulmonary vascularity normal. No adenopathy. No bone lesions. IMPRESSION: Tube positions as described without pneumothorax. Left base atelectasis. Lungs elsewhere clear. Heart prominent. Electronically Signed   By: Bretta Bang III M.D.   On: 04/13/2020 11:10     Assessment/Plan: 49 y.o. female who is intubated and sedated therefore unable to provide any history.  All history obtained from the chart.  Patient with a history of diabetes and was brought to the ED due to altered mental status.  She was found on the floor at home covered in feces, urine and vomit by her boyfriend.  On EMS arrival they reported the patient to be confused, agitated, unable to provide history or engage in medical care.  Blood sugar on scene read as "high." Initial blood sugar here of 589, found to be in DKA, ?sepsis.  Due to inability to protect her airway the patient was intubated and sedated.  Noted to have posturing, described as decerebrate.  4mg  of Ativan given.  Presentation very likely metabolic in etiology.  Will rule out other possible etiologies.  Head CT personally reviewed and shows no acute changes.  EEG pending.    Recommendations: 1. EEG pending 2. Agree with addressing medical issues 3. Patient felt to possibly be septic.  WBC count elevated.  Patient started on antibiotics.  If no improvement with treatment of DKA and ?infection will consider LP if sepsis remains in the differential.     , MD Neurology [phone] 04/13/2020, 1:57 PM

## 2020-04-14 ENCOUNTER — Inpatient Hospital Stay: Payer: Medicaid Other

## 2020-04-14 DIAGNOSIS — J9601 Acute respiratory failure with hypoxia: Secondary | ICD-10-CM

## 2020-04-14 LAB — PROTIME-INR
INR: 1.1 (ref 0.8–1.2)
Prothrombin Time: 13.3 seconds (ref 11.4–15.2)

## 2020-04-14 LAB — CBC
HCT: 30.6 % — ABNORMAL LOW (ref 36.0–46.0)
Hemoglobin: 10.3 g/dL — ABNORMAL LOW (ref 12.0–15.0)
MCH: 31.3 pg (ref 26.0–34.0)
MCHC: 33.7 g/dL (ref 30.0–36.0)
MCV: 93 fL (ref 80.0–100.0)
Platelets: 263 10*3/uL (ref 150–400)
RBC: 3.29 MIL/uL — ABNORMAL LOW (ref 3.87–5.11)
RDW: 12.7 % (ref 11.5–15.5)
WBC: 17.5 10*3/uL — ABNORMAL HIGH (ref 4.0–10.5)
nRBC: 0 % (ref 0.0–0.2)

## 2020-04-14 LAB — GLUCOSE, CAPILLARY
Glucose-Capillary: 163 mg/dL — ABNORMAL HIGH (ref 70–99)
Glucose-Capillary: 183 mg/dL — ABNORMAL HIGH (ref 70–99)
Glucose-Capillary: 184 mg/dL — ABNORMAL HIGH (ref 70–99)
Glucose-Capillary: 197 mg/dL — ABNORMAL HIGH (ref 70–99)
Glucose-Capillary: 197 mg/dL — ABNORMAL HIGH (ref 70–99)
Glucose-Capillary: 202 mg/dL — ABNORMAL HIGH (ref 70–99)
Glucose-Capillary: 203 mg/dL — ABNORMAL HIGH (ref 70–99)
Glucose-Capillary: 212 mg/dL — ABNORMAL HIGH (ref 70–99)
Glucose-Capillary: 226 mg/dL — ABNORMAL HIGH (ref 70–99)

## 2020-04-14 LAB — BLOOD GAS, ARTERIAL
Acid-base deficit: 1.1 mmol/L (ref 0.0–2.0)
Bicarbonate: 23.5 mmol/L (ref 20.0–28.0)
FIO2: 0.24
MECHVT: 500 mL
Mechanical Rate: 16
O2 Saturation: 94 %
PEEP: 5 cmH2O
Patient temperature: 37
pCO2 arterial: 38 mmHg (ref 32.0–48.0)
pH, Arterial: 7.4 (ref 7.350–7.450)
pO2, Arterial: 71 mmHg — ABNORMAL LOW (ref 83.0–108.0)

## 2020-04-14 LAB — CSF CELL COUNT WITH DIFFERENTIAL
Eosinophils, CSF: 0 %
Eosinophils, CSF: 0 %
Lymphs, CSF: 97 %
Lymphs, CSF: 97 %
Monocyte-Macrophage-Spinal Fluid: 1 %
Monocyte-Macrophage-Spinal Fluid: 1 %
RBC Count, CSF: 67 /mm3 — ABNORMAL HIGH (ref 0–3)
RBC Count, CSF: 430 /mm3 — ABNORMAL HIGH (ref 0–3)
Segmented Neutrophils-CSF: 2 %
Segmented Neutrophils-CSF: 2 %
Tube #: 3
Tube #: 1
WBC, CSF: 10 /mm3 — ABNORMAL HIGH (ref 0–5)
WBC, CSF: 49 /mm3 (ref 0–5)

## 2020-04-14 LAB — BASIC METABOLIC PANEL
Anion gap: 5 (ref 5–15)
Anion gap: 7 (ref 5–15)
BUN: 31 mg/dL — ABNORMAL HIGH (ref 6–20)
BUN: 32 mg/dL — ABNORMAL HIGH (ref 6–20)
CO2: 25 mmol/L (ref 22–32)
CO2: 25 mmol/L (ref 22–32)
Calcium: 7.5 mg/dL — ABNORMAL LOW (ref 8.9–10.3)
Calcium: 7.6 mg/dL — ABNORMAL LOW (ref 8.9–10.3)
Chloride: 109 mmol/L (ref 98–111)
Chloride: 112 mmol/L — ABNORMAL HIGH (ref 98–111)
Creatinine, Ser: 1.2 mg/dL — ABNORMAL HIGH (ref 0.44–1.00)
Creatinine, Ser: 1.32 mg/dL — ABNORMAL HIGH (ref 0.44–1.00)
GFR calc Af Amer: 55 mL/min — ABNORMAL LOW (ref >60)
GFR calc Af Amer: >60 mL/min (ref >60)
GFR calc non Af Amer: 48 mL/min — ABNORMAL LOW (ref >60)
GFR calc non Af Amer: 53 mL/min — ABNORMAL LOW (ref >60)
Glucose, Bld: 187 mg/dL — ABNORMAL HIGH (ref 70–99)
Glucose, Bld: 208 mg/dL — ABNORMAL HIGH (ref 70–99)
Potassium: 4.6 mmol/L (ref 3.5–5.1)
Potassium: 4.8 mmol/L (ref 3.5–5.1)
Sodium: 141 mmol/L (ref 135–145)
Sodium: 142 mmol/L (ref 135–145)

## 2020-04-14 LAB — HEMOGLOBIN A1C
Hgb A1c MFr Bld: 10.7 % — ABNORMAL HIGH (ref 4.8–5.6)
Mean Plasma Glucose: 260.39 mg/dL

## 2020-04-14 LAB — PROCALCITONIN: Procalcitonin: 0.31 ng/mL

## 2020-04-14 LAB — URINE CULTURE: Culture: NO GROWTH

## 2020-04-14 LAB — STREP PNEUMONIAE URINARY ANTIGEN: Strep Pneumo Urinary Antigen: NEGATIVE

## 2020-04-14 LAB — PROTEIN, CSF: Total  Protein, CSF: 41 mg/dL (ref 15–45)

## 2020-04-14 LAB — CRYPTOCOCCAL ANTIGEN, CSF: Crypto Ag: NEGATIVE

## 2020-04-14 LAB — PROTEIN AND GLUCOSE, CSF
Glucose, CSF: 131 mg/dL — ABNORMAL HIGH (ref 40–70)
Total  Protein, CSF: 41 mg/dL (ref 15–45)

## 2020-04-14 LAB — APTT: aPTT: 34 seconds (ref 24–36)

## 2020-04-14 LAB — TRIGLYCERIDES: Triglycerides: 118 mg/dL (ref <150)

## 2020-04-14 LAB — HIV ANTIBODY (ROUTINE TESTING W REFLEX): HIV Screen 4th Generation wRfx: NONREACTIVE

## 2020-04-14 LAB — GLUCOSE, CSF: Glucose, CSF: 130 mg/dL — ABNORMAL HIGH (ref 40–70)

## 2020-04-14 MED ORDER — PRO-STAT SUGAR FREE PO LIQD
60.0000 mL | Freq: Three times a day (TID) | ORAL | Status: DC
  Administered 2020-04-14 – 2020-04-17 (×9): 60 mL

## 2020-04-14 MED ORDER — INSULIN ASPART 100 UNIT/ML ~~LOC~~ SOLN
0.0000 [IU] | SUBCUTANEOUS | Status: DC
  Administered 2020-04-14: 3 [IU] via SUBCUTANEOUS
  Administered 2020-04-14 (×3): 5 [IU] via SUBCUTANEOUS
  Administered 2020-04-14: 3 [IU] via SUBCUTANEOUS
  Administered 2020-04-14 – 2020-04-15 (×2): 5 [IU] via SUBCUTANEOUS
  Administered 2020-04-15: 11 [IU] via SUBCUTANEOUS
  Administered 2020-04-15 (×2): 5 [IU] via SUBCUTANEOUS
  Administered 2020-04-15: 11 [IU] via SUBCUTANEOUS
  Filled 2020-04-14 (×11): qty 1

## 2020-04-14 MED ORDER — INSULIN DETEMIR 100 UNIT/ML ~~LOC~~ SOLN
0.3000 [IU]/kg | Freq: Every day | SUBCUTANEOUS | Status: DC
  Administered 2020-04-14 – 2020-04-18 (×5): 34 [IU] via SUBCUTANEOUS
  Filled 2020-04-14 (×9): qty 0.34

## 2020-04-14 MED ORDER — VANCOMYCIN HCL 1.25 G IV SOLR
1250.0000 mg | Freq: Two times a day (BID) | INTRAVENOUS | Status: DC
  Filled 2020-04-14: qty 1250

## 2020-04-14 MED ORDER — NOREPINEPHRINE 16 MG/250ML-% IV SOLN
0.0000 ug/min | INTRAVENOUS | Status: DC
  Administered 2020-04-14: 2 ug/min via INTRAVENOUS
  Administered 2020-04-16: 10 ug/min via INTRAVENOUS
  Administered 2020-04-18: 5 ug/min via INTRAVENOUS
  Filled 2020-04-14 (×3): qty 250

## 2020-04-14 MED ORDER — ENOXAPARIN SODIUM 40 MG/0.4ML ~~LOC~~ SOLN
40.0000 mg | Freq: Two times a day (BID) | SUBCUTANEOUS | Status: DC
  Administered 2020-04-15 – 2020-04-17 (×4): 40 mg via SUBCUTANEOUS
  Filled 2020-04-14 (×4): qty 0.4

## 2020-04-14 MED ORDER — VITAL HIGH PROTEIN PO LIQD
1000.0000 mL | ORAL | Status: DC
  Administered 2020-04-14 – 2020-04-16 (×3): 1000 mL

## 2020-04-14 MED ORDER — VANCOMYCIN HCL 1250 MG/250ML IV SOLN
1250.0000 mg | Freq: Two times a day (BID) | INTRAVENOUS | Status: DC
  Filled 2020-04-14 (×2): qty 250

## 2020-04-14 MED ORDER — VANCOMYCIN HCL IN DEXTROSE 1-5 GM/200ML-% IV SOLN
1000.0000 mg | Freq: Two times a day (BID) | INTRAVENOUS | Status: DC
  Administered 2020-04-14 – 2020-04-16 (×4): 1000 mg via INTRAVENOUS
  Filled 2020-04-14 (×7): qty 200

## 2020-04-14 MED ORDER — DEXTROSE 5 % IV SOLN
10.0000 mg/kg | Freq: Three times a day (TID) | INTRAVENOUS | Status: AC
  Administered 2020-04-14 – 2020-04-18 (×14): 825 mg via INTRAVENOUS
  Filled 2020-04-14 (×17): qty 16.5

## 2020-04-14 MED ORDER — LACTATED RINGERS IV SOLN
INTRAVENOUS | Status: DC
  Administered 2020-04-14: 100 mL/h via INTRAVENOUS

## 2020-04-14 MED ORDER — ADULT MULTIVITAMIN W/MINERALS CH
1.0000 | ORAL_TABLET | Freq: Every day | ORAL | Status: DC
  Administered 2020-04-15 – 2020-04-17 (×3): 1
  Filled 2020-04-14 (×3): qty 1

## 2020-04-14 NOTE — Progress Notes (Signed)
PHARMACY CONSULT NOTE  Pharmacy Consult for Electrolyte Monitoring and Replacement   Recent Labs: Potassium (mmol/L)  Date Value  04/14/2020 4.6   Calcium (mg/dL)  Date Value  54/62/7035 7.6 (L)   Albumin (g/dL)  Date Value  00/93/8182 3.4 (L)   Sodium (mmol/L)  Date Value  04/14/2020 141   Assessment: 49 year old female admitted with DKA, started on insulin drip. DKA has resolved, patient transitioned off insulin drip. Patient also with respiratory failure requiring intubation. Concern for possible meningitis, LP pending. Pharmacy to manage electrolytes.  Goal of Therapy:  Electrolytes WNL  Plan:  Electrolytes WNL. Will follow up with morning labs.  Pricilla Riffle ,PharmD Clinical Pharmacist 04/14/2020 2:40 PM

## 2020-04-14 NOTE — Progress Notes (Signed)
The Clinical status was relayed to family in detail. Son Cristal Deer was updated  Updated and notified of patients medical condition.  Patient remains unresponsive and will not open eyes to command.   patient with increased WOB and using accessory muscles to breathe Explained to family course of therapy and the modalities     Patient with Progressive multiorgan failure with high probability of a very low chance of meaningful recovery despite all aggressive and optimal medical therapy.   Family understands the situation.    Family are satisfied with Plan of action and management. All questions answered  Additional CC time 32 mins   Jaselle Pryer Santiago Glad, M.D.  [facility]  Medical Director [facility] Medical Director [facility] Cardio-Pulmonary Department

## 2020-04-14 NOTE — Progress Notes (Signed)
CRITICAL CARE NOTE  49 y.o.femalewith a history of diabetes who is brought to the ED due to altered mental status.  She was found on the floor at home covered in feces urine and vomit by her boyfriend.  EMS report the patient is confused, agitated, unable to provide history or engage in medical care. Blood sugar on scene read as "high." Patient with acute resp failure Acute toxic metabolic encephalopathy Intubated for severe resp failure Patient arrived to ICU with severe resp failure and postural decortication  SIGNIFICANT EVENTS 5/13 intubated, sedated, DKA admitted for resp failure 5/14 CVL placed, on pressors, weaned off Insulin drip     CC  follow up respiratory failure  HPI Patient remains critically ill Prognosis is guarded   BP (!) 181/79   Pulse 72   Temp 98.1 F (36.7 C)   Resp 16   Ht 5' 7"  (1.702 m)   Wt 120.2 kg   SpO2 98%   BMI 41.50 kg/m    I/O last 3 completed shifts: In: 10530.2 [I.V.:1749.9; NG/GT:60; IV Piggyback:8720.3] Out: 620 [Urine:290; Emesis/NG output:330] Total I/O In: 505.3 [I.V.:505.3] Out: -   SpO2: 98 % FiO2 (%): 24 %  Estimated body mass index is 41.5 kg/m as calculated from the following:   Height as of this encounter: 5' 7"  (1.702 m).   Weight as of this encounter: 120.2 kg.    REVIEW OF SYSTEMS  PATIENT IS UNABLE TO PROVIDE COMPLETE REVIEW OF SYSTEMS DUE TO SEVERE CRITICAL ILLNESS        PHYSICAL EXAMINATION:  GENERAL:critically ill appearing, +resp distress HEAD: Normocephalic, atraumatic.  EYES: Pupils equal, round, reactive to light.  No scleral icterus.  MOUTH: Moist mucosal membrane. NECK: Supple.  PULMONARY: +rhonchi, +wheezing CARDIOVASCULAR: S1 and S2. Regular rate and rhythm. No murmurs, rubs, or gallops.  GASTROINTESTINAL: Soft, nontender, -distended.  Positive bowel sounds.   MUSCULOSKELETAL: No swelling, clubbing, or edema.  NEUROLOGIC: obtunded, GCS<8 SKIN:intact,warm,dry  MEDICATIONS: I  have reviewed all medications and confirmed regimen as documented   CULTURE RESULTS   Recent Results (from the past 240 hour(s))  MRSA PCR Screening     Status: None   Collection Time: 04/13/20  9:14 AM   Specimen: Nasopharyngeal  Result Value Ref Range Status   MRSA by PCR NEGATIVE NEGATIVE Final    Comment:        The GeneXpert MRSA Assay (FDA approved for NASAL specimens only), is one component of a comprehensive MRSA colonization surveillance program. It is not intended to diagnose MRSA infection nor to guide or monitor treatment for MRSA infections. Performed at [facility], [address] [place], [place] [address]   SARS Coronavirus 2 by RT PCR (hospital order, performed in [facility] hospital lab) Nasopharyngeal Nasopharyngeal Swab     Status: None   Collection Time: 04/13/20  9:17 AM   Specimen: Nasopharyngeal Swab  Result Value Ref Range Status   SARS Coronavirus 2 NEGATIVE NEGATIVE Final    Comment: (NOTE) SARS-CoV-2 target nucleic acids are NOT DETECTED. The SARS-CoV-2 RNA is generally detectable in upper and lower respiratory specimens during the acute phase of infection. The lowest concentration of SARS-CoV-2 viral copies this assay can detect is 250 copies / mL. A negative result does not preclude SARS-CoV-2 infection and should not be used as the sole basis for treatment or other patient management decisions.  A negative result may occur with improper specimen collection / handling, submission of specimen other than nasopharyngeal swab, presence of viral mutation(s)  within the areas targeted by this assay, and inadequate number of viral copies (<250 copies / mL). A negative result must be combined with clinical observations, patient history, and epidemiological information. Fact Sheet for Patients:   StrictlyIdeas.no Fact Sheet for Healthcare Providers: BankingDealers.co.za This test is not yet  approved or cleared  by the [place] FDA and has been authorized for detection and/or diagnosis of SARS-CoV-2 by FDA under an Emergency Use Authorization (EUA).  This EUA will remain in effect (meaning this test can be used) for the duration of the COVID-19 declaration under Section 564(b)(1) of the Act, 21 U.S.C. section 360bbb-3(b)(1), unless the authorization is terminated or revoked sooner. Performed at [facility], [address] [place], [place] [address]           IMAGING    EEG  Result Date: 04/13/2020 Alexis Goodell, MD     04/13/2020  3:26 PM ELECTROENCEPHALOGRAM REPORT Patient: Jessica Santiago       Room #: IC14A-AA EEG No. ID: [id number] Age: 49 y.o.        Sex: female Requesting Physician: Adaeze Better Report Date:  04/13/2020       Interpreting Physician: Alexis Goodell History: Jessica Santiago is an 49 y.o. female with DKA and AMS Medications: Fentanyl, Propofol, Insulin Conditions of Recording:  This is a 21 channel routine scalp EEG performed with bipolar and monopolar montages arranged in accordance to the international 10/20 system of electrode placement. One channel was dedicated to EKG recording. The patient is in the intubated and sedated state.  Patient is positioned with her head to the left. Description: Artifact is prominent during the recording.  The background activity is discontinuous. It consists of bursts of polyspike, spike and slow wave activity alternating with periods of attenuation. The burst activity lasts up to 3 seconds and is generalized for the most part although there are some occasions when this burst activity is more prominent from the left hemisphere.  The periods of attenuation last up to 12 seconds. This discontinuous activity is maintained throughout the tracing. No activation procedures were performed IMPRESSION: This is an abnormal EEG due to a burst-suppression pattern seen throughout the tracing.  Burst suppression pattern can be seen in a  variety of circumstances, including anesthesia, drug intoxication, hypothermia, as well as cerebral anoxia.  Clinical/neurological, and radiographic correlation advised.  Alexis Goodell, MD Neurology [phone] 04/13/2020, 3:19 PM   DG Abdomen 1 View  Result Date: 04/13/2020 CLINICAL DATA:  Orogastric tube placement EXAM: ABDOMEN - 1 VIEW COMPARISON:  None. FINDINGS: Orogastric tube tip and side port in stomach. Moderate stool in colon. No bowel dilatation or air-fluid level to suggest bowel obstruction. No free air. IMPRESSION: Orogastric tube tip and side port in stomach. No bowel obstruction or free air evident on supine examination. Electronically Signed   By: Lowella Grip III M.D.   On: 04/13/2020 11:12   CT HEAD WO CONTRAST  Result Date: 04/13/2020 CLINICAL DATA:  Altered mental status. The patient is unresponsive. No known injury. EXAM: CT HEAD WITHOUT CONTRAST TECHNIQUE: Contiguous axial images were obtained from the base of the skull through the vertex without intravenous contrast. COMPARISON:  None. FINDINGS: Brain: No evidence of acute infarction, hemorrhage, hydrocephalus, extra-axial collection or mass lesion/mass effect. Vascular: Atherosclerosis noted. Skull: Intact.  No focal lesion. Sinuses/Orbits: Negative. Other: None. IMPRESSION: No acute abnormality. Atherosclerosis. Electronically Signed   By: Inge Rise M.D.   On: 04/13/2020 14:14   DG Chest Port 1 View  Result  Date: 04/14/2020 CLINICAL DATA:  Central line placement, intubated EXAM: PORTABLE CHEST 1 VIEW COMPARISON:  04/13/2020 FINDINGS: Single frontal view of the chest demonstrates stable endotracheal and enteric catheters. Left internal jugular catheter tip projects over superior vena cava. Cardiac silhouette is stable. There is persistent central vascular congestion, with developing patchy bilateral airspace disease. No effusion or pneumothorax. No acute bony abnormalities. IMPRESSION: 1. Support devices as above.  2. Progressive central vascular congestion, with developing patchy bilateral airspace disease. Findings could reflect edema or infection. Electronically Signed   By: Randa Ngo M.D.   On: 04/14/2020 01:12   DG Chest Portable 1 View  Result Date: 04/13/2020 CLINICAL DATA:  Hypoxia and confusion EXAM: PORTABLE CHEST 1 VIEW COMPARISON:  None. FINDINGS: Endotracheal tube tip is 3.2 cm above the carina. Nasogastric tube tip and side port are below the diaphragm. Side port is seen in the stomach. No pneumothorax. There is left base atelectasis. Lungs elsewhere clear. Heart is slightly enlarged with pulmonary vascularity normal. No adenopathy. No bone lesions. IMPRESSION: Tube positions as described without pneumothorax. Left base atelectasis. Lungs elsewhere clear. Heart prominent. Electronically Signed   By: Lowella Grip III M.D.   On: 04/13/2020 11:10     Nutrition Status:       BMP Latest Ref Rng & Units 04/14/2020 04/14/2020 04/13/2020  Glucose 70 - 99 mg/dL 208(H) 187(H) 797(HH)  BUN 6 - 20 mg/dL 31(H) 32(H) 26(H)  Creatinine 0.44 - 1.00 mg/dL 1.20(H) 1.32(H) 1.22(H)  Sodium 135 - 145 mmol/L 141 142 138  Potassium 3.5 - 5.1 mmol/L 4.6 4.8 4.5  Chloride 98 - 111 mmol/L 109 112(H) 110  CO2 22 - 32 mmol/L 25 25 21(L)  Calcium 8.9 - 10.3 mg/dL 7.6(L) 7.5(L) 7.3(L)       Indwelling Urinary Catheter continued, requirement due to   Reason to continue Indwelling Urinary Catheter strict Intake/Output monitoring for hemodynamic instability   Central Line/ continued, requirement due to  Reason to continue [place] of central venous pressure or other hemodynamic parameters and poor IV access   Ventilator continued, requirement due to severe respiratory failure   Ventilator Sedation RASS 0 to -2      ASSESSMENT AND PLAN SYNOPSIS Severe ACUTE Hypoxic and Hypercapnic Respiratory Failure due severe acidosis due to DKA with toxic metabolic encephalopathy complicated by  fevers ?etiology probable c/w CAP SEPSIS WITH PNEUMONIA  Severe ACUTE Hypoxic and Hypercapnic Respiratory Failure -continue Full MV support -continue Bronchodilator Therapy -Wean Fio2 and PEEP as tolerated -will perform SAT/SBT when respiratory parameters are met -VAP/VENT bundle implementation  ACUTE SYSTOLIC CARDIAC FAILURE- EF -oxygen as needed -Lasix as tolerated -follow up cardiac enzymes as indicated -follow up cardiology recs   Morbid obesity, possible OSA.   Will certainly impact respiratory mechanics, ventilator weaning Suspect will need to consider additional PEEP,    ACUTE KIDNEY INJURY/Renal Failure -continue Foley Catheter-assess need -Avoid nephrotoxic agents -Follow urine output, BMP -Ensure adequate renal perfusion, optimize oxygenation -Renal dose medications     NEUROLOGY - intubated and sedated - minimal sedation to achieve a RASS goal: -1 Wake up assessment pending  Acute toxic metabolic encephalopathy Follow up NEUROLOGY RECS CT HEAD AND EEG NEG   SHOCK-SEPSIS/HYPOVOLUMIC ?MENINGITIS -use vasopressors to keep MAP>65 -follow ABG and LA -follow up cultures -emperic ABX -aggressive IV fluid resuscitation  CARDIAC ICU monitoring  ID -continue IV abx as prescibed -follow up cultures  GI GI PROPHYLAXIS as indicated  NUTRITIONAL STATUS Nutrition Status:     DIET-->TF's  as tolerated Constipation protocol as indicated  ENDO - will use ICU hypoglycemic\Hyperglycemia protocol if indicated     ELECTROLYTES -follow labs as needed -replace as needed -pharmacy consultation and following   DVT/GI PRX ordered and assessed TRANSFUSIONS AS NEEDED MONITOR FSBS I Assessed the need for Labs I Assessed the need for Foley I Assessed the need for Central Venous Line Family Discussion when available I Assessed the need for Mobilization I made an Assessment of medications to be adjusted accordingly Safety Risk assessment  completed   CASE DISCUSSED IN MULTIDISCIPLINARY ROUNDS WITH ICU TEAM  Critical Care Time devoted to patient care services described in this note is 37 minutes.   Overall, patient is critically ill, prognosis is guarded.  Patient with Multiorgan failure and at high risk for cardiac arrest and death.    Corrin Wilms, M.D.  [facility]  Medical Director [place], [place] Director [facility] Cardio-Pulmonary Department

## 2020-04-14 NOTE — Progress Notes (Signed)
Pharmacy Antibiotic Note  Jessica Santiago is a 49 y.o. female admitted on 04/13/2020 with meningitis.  Pharmacy has been consulted for Vancomycin and Acyclovir dosing.  Plan: Acyclovir 825mg  IV q8hrs  Vancomycin 2gm x 1 then 1250mg  IV q12hrs  Height: 5\' 7"  (170.2 cm) Weight: 113.8 kg (250 lb 14.1 oz) IBW/kg (Calculated) : 61.6  Temp (24hrs), Avg:99.5 F (37.5 C), Min:95.5 F (35.3 C), Max:101.3 F (38.5 C)  Recent Labs  Lab 04/13/20 0917 04/13/20 1042 04/13/20 1445 04/13/20 2114 04/14/20 0007  WBC 20.7*  --   --   --   --   CREATININE 1.43*  --  1.40* 1.22* 1.32*  LATICACIDVEN 3.2* 2.4*  --   --   --     Estimated Creatinine Clearance: 67.9 mL/min (A) (by C-G formula based on SCr of 1.32 mg/dL (H)).    Not on File  Antimicrobials this admission:   >>    >>   Dose adjustments this admission:   Microbiology results:  BCx:   UCx:    Sputum:    MRSA PCR:   Thank you for allowing pharmacy to be a part of this patient's care.  04/15/20 A 04/14/2020 2:28 AM

## 2020-04-14 NOTE — Procedures (Signed)
Patient is intubated. Informed consent was obtained from the patient's son prior to the procedure, including potential complications of headache, allergy, and pain.  With the patient prone, the lower back was prepped with Betadine.  1% Lidocaine was used for local anesthesia.  Lumbar puncture was performed at the [L4-5] level with a right interlaminar approach using a [22] gauge 5 inch needle with return of [initially pink tinged and subsequently clear] CSF with an opening pressure of [22] cm water.  [8] ml of CSF were obtained for laboratory studies.  The patient tolerated the procedure well and there were no apparent complications.

## 2020-04-14 NOTE — Progress Notes (Signed)
RT assisted with patient transport to and from interventional radiology while patient on trilogy ventilator. No complications arose during trip. Pt returned back to servi-I vent once back in ICU room.

## 2020-04-14 NOTE — Progress Notes (Signed)
Initial Nutrition Assessment  DOCUMENTATION CODES:   Obesity unspecified  INTERVENTION:  Initiate Vital High Protein at 20 mL/hr (480 mL goal daily volume) + Pro-Stat 60 mL TID per tube. Provides 1080 kcal, 132 grams of protein, 403 mL H2O daily. With current propofol rate provides 1626 kcal daily.  Provide MVI daily per tube as goal TF regimen does not meet 100% RDIs for vitamins/minerals.  Provide minimum free water flush of 30 mL Q4hrs to maintain tube patency.  NUTRITION DIAGNOSIS:   Inadequate oral intake related to inability to eat as evidenced by NPO status.  GOAL:   Patient will meet greater than or equal to 90% of their needs  MONITOR:   Vent status, Labs, Weight trends, TF tolerance, I & O's  REASON FOR ASSESSMENT:   Ventilator    ASSESSMENT:   49 year old female with PMHx of DM admitted with acute hypoxic and hypercapnic respiratory failure due to severe acidosis requiring mechanical intubation, DKA, toxic metabolic encephalopathy, sepsis, likely PNA, acute systolic cardiac failure, AKI.   Patient is currently intubated on ventilator support MV: 7.6 L/min Temp (24hrs), Avg:99 F (37.2 C), Min:97.3 F (36.3 C), Max:101.3 F (38.5 C)  Propofol: 20.7 ml/hr (546 kcal daily)  Medications reviewed and include: Decadron 15 mg Q6hrs IV, Colace 100 mg BID, Novolog 0-15 units Q4hrs, Levemir 0.3 units/kg QHS, Miralax 17 grams daily, acyclovir, ceftriaxone, famotidine, fentanyl gtt, LR at 100 mL/hr, propofol gtt, vancomycin.  Labs reviewed: CBG 183-226, BUN 31, Creatinine 1.2.  Enteral Access: 16 Fr. OGT; terminates in stomach per abdominal x-ray 5/13  I/O: +10530 from IV fluids yesterday; only 290 mL UOP yesterday  Will use weight of 113.8 kg on 04/13/2020 to estimate needs at this time. Will continue to monitor weights and adjust as needed.  Discussed with RN and on rounds. Plan is to start tube feeds today.  NUTRITION - FOCUSED PHYSICAL EXAM:    Most Recent  Value  Orbital Region  No depletion  Upper Arm Region  No depletion  Thoracic and Lumbar Region  No depletion  Buccal Region  Unable to assess  Temple Region  No depletion  Clavicle Bone Region  No depletion  Clavicle and Acromion Bone Region  No depletion  Scapular Bone Region  Unable to assess  Dorsal Hand  No depletion  Patellar Region  No depletion  Anterior Thigh Region  No depletion  Posterior Calf Region  No depletion  Edema (RD Assessment)  Mild  Hair  Reviewed  Eyes  Unable to assess  Mouth  Unable to assess  Skin  Reviewed  Nails  Reviewed     Diet Order:   Diet Order            Diet NPO time specified  Diet effective now             EDUCATION NEEDS:   No education needs have been identified at this time  Skin:  Skin Assessment: Reviewed RN Assessment  Last BM:  04/13/2020  Height:   Ht Readings from Last 1 Encounters:  04/13/20 5\' 7"  (1.702 m)   Weight:   Wt Readings from Last 1 Encounters:  04/14/20 120.2 kg   Ideal Body Weight:  61.4 kg  BMI:  Body mass index is 41.5 kg/m.  Estimated Nutritional Needs:   Kcal:  1252-1593 (11-14 kcal/kg)  Protein:  123 grams (2 grams/kg IBW)  Fluid:  1.8 L/day  04/16/20, MS, RD, LDN Pager number available on Amion

## 2020-04-14 NOTE — Progress Notes (Signed)
PHARMACY CONSULT NOTE  Pharmacy Consult for Electrolyte Monitoring and Replacement   Recent Labs: Potassium (mmol/L)  Date Value  04/14/2020 4.6   Calcium (mg/dL)  Date Value  38/09/1750 7.6 (L)   Albumin (g/dL)  Date Value  02/58/5277 3.4 (L)   Sodium (mmol/L)  Date Value  04/14/2020 141   Assessment: 49 year old female admitted with DKA, started on insulin drip. Patient also with respiratory failure requiring intubation. Pharmacy to manage electrolytes.  Goal of Therapy:  Electrolytes WNL, K > 4 while on insulin drip  Plan:  Potassium = 4.6, at goal.  Will continue to monitor closely while on insulin drip.   Wayland Denis ,PharmD Clinical Pharmacist 04/14/2020 5:52 AM
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
## 2020-04-14 NOTE — Progress Notes (Signed)
Subjective: Patient remains intubated and sedated.  Sedation holiday this morning patient became agitated but was moving all extremities strongly.  Patient febrile overnight.  Objective: Current vital signs: BP 119/64   Pulse 87   Temp 98.1 F (36.7 C)   Resp 16   Ht 5' 7"  (1.702 m)   Wt 120.2 kg   SpO2 99%   BMI 41.50 kg/m  Vital signs in last 24 hours: Temp:  [97.3 F (36.3 C)-101.3 F (38.5 C)] 98.1 F (36.7 C) (05/14 0900) Pulse Rate:  [66-119] 87 (05/14 0900) Resp:  [12-20] 16 (05/14 0900) BP: (72-181)/(41-91) 119/64 (05/14 0900) SpO2:  [97 %-100 %] 99 % (05/14 1122) FiO2 (%):  [24 %] 24 % (05/14 1122) Weight:  [113.8 kg-120.2 kg] 120.2 kg (05/14 0353)  Intake/Output from previous day: 05/13 0701 - 05/14 0700 In: 10530.2 [I.V.:1750; NG/GT:60; IV Piggyback:8720.3] Out: 620 [Urine:290; Emesis/NG output:330] Intake/Output this shift: Total I/O In: 617.9 [I.V.:607; IV Piggyback:10.9] Out: 225 [Urine:225] Nutritional status:  Diet Order            Diet NPO time specified  Diet effective now              Neurologic Exam: Mental Status: Patient does not respond to verbal stimuli.  Does not respond to deep sternal rub.  Does not follow commands.  No verbalizations noted.  Cranial Nerves: II: patient does not respond confrontation bilaterally, pupils right 2 mm, left 2 mm,and unreactive bilaterally III,IV,VI: Oculocephalic response absent bilaterally.  V,VII: corneal reflex absent bilaterally  VIII: patient does not respond to verbal stimuli IX,X: gag reflex reduced, XI: trapezius strength unable to test bilaterally XII: tongue strength unable to test Motor: Extremities flaccid throughout.  Some decerebrate posturing noted at times Sensory: Does not respond to noxious stimuli in any extremity. Deep Tendon Reflexes:  Absent throughout. Plantars: Mute bilaterally Cerebellar: Unable to perform  Lab Results: Basic Metabolic Panel: Recent Labs  Lab  04/13/20 0917 04/13/20 0917 04/13/20 1445 04/13/20 1445 04/13/20 2114 04/14/20 0007 04/14/20 0336  NA 139  --  140  --  138 142 141  K 5.1  --  3.8  --  4.5 4.8 4.6  CL 98  --  108  --  110 112* 109  CO2 21*  --  20*  --  21* 25 25  GLUCOSE 589*  --  209*  --  797* 187* 208*  BUN 32*  --  31*  --  26* 32* 31*  CREATININE 1.43*  --  1.40*  --  1.22* 1.32* 1.20*  CALCIUM 8.8*   < > 7.7*   < > 7.3* 7.5* 7.6*   < > = values in this interval not displayed.    Liver Function Tests: Recent Labs  Lab 04/13/20 0917  AST 19  ALT 17  ALKPHOS 72  BILITOT 2.1*  PROT 7.1  ALBUMIN 3.4*   Recent Labs  Lab 04/13/20 0917  LIPASE 24   No results for input(s): AMMONIA in the last 168 hours.  CBC: Recent Labs  Lab 04/13/20 0917 04/14/20 0336  WBC 20.7* 17.5*  NEUTROABS 18.2*  --   HGB 12.9 10.3*  HCT 38.6 30.6*  MCV 93.2 93.0  PLT 358 263    Cardiac Enzymes: No results for input(s): CKTOTAL, CKMB, CKMBINDEX, TROPONINI in the last 168 hours.  Lipid Panel: Recent Labs  Lab 04/14/20 1101  TRIG 118    CBG: Recent Labs  Lab 04/14/20 0138 04/14/20 0233 04/14/20 0311 04/14/20 0741  04/14/20 1126  [place]* 42*    Microbiology: Results for orders placed or performed during the hospital encounter of 04/13/20  MRSA PCR Screening     Status: None   Collection Time: 04/13/20  9:14 AM   Specimen: Nasopharyngeal  Result Value Ref Range Status   MRSA by PCR NEGATIVE NEGATIVE Final    Comment:        The GeneXpert MRSA Assay (FDA approved for NASAL specimens only), is one component of a comprehensive MRSA colonization surveillance program. It is not intended to diagnose MRSA infection nor to guide or monitor treatment for MRSA infections. Performed at [facility], [address] [place], [place] [address]   Culture, blood (routine x 2)     Status: None (Preliminary result)   Collection Time: 04/13/20  9:17 AM   Specimen: BLOOD   Result Value Ref Range Status   Specimen Description BLOOD BLOOD RIGHT HAND  Final   Special Requests   Final    BOTTLES DRAWN AEROBIC AND ANAEROBIC Blood Culture adequate volume   Culture   Final    NO GROWTH < 24 HOURS Performed at [facility], [address] [place], [place] [address]    Report Status PENDING  Incomplete  Culture, blood (routine x 2)     Status: None (Preliminary result)   Collection Time: 04/13/20  9:17 AM   Specimen: BLOOD  Result Value Ref Range Status   Specimen Description BLOOD BLOOD LEFT HAND  Final   Special Requests   Final    BOTTLES DRAWN AEROBIC AND ANAEROBIC Blood Culture adequate volume   Culture   Final    NO GROWTH < 24 HOURS Performed at [facility], [address] [place], [place] [address]    Report Status PENDING  Incomplete  SARS Coronavirus 2 by RT PCR (hospital order, performed in [place] hospital lab) Nasopharyngeal Nasopharyngeal Swab     Status: None   Collection Time: 04/13/20  9:17 AM   Specimen: Nasopharyngeal Swab  Result Value Ref Range Status   SARS Coronavirus 2 NEGATIVE NEGATIVE Final    Comment: (NOTE) SARS-CoV-2 target nucleic acids are NOT DETECTED. The SARS-CoV-2 RNA is generally detectable in upper and lower respiratory specimens during the acute phase of infection. The lowest concentration of SARS-CoV-2 viral copies this assay can detect is 250 copies / mL. A negative result does not preclude SARS-CoV-2 infection and should not be used as the sole basis for treatment or other patient management decisions.  A negative result may occur with improper specimen collection / handling, submission of specimen other than nasopharyngeal swab, presence of viral mutation(s) within the areas targeted by this assay, and inadequate number of viral copies (<250 copies / mL). A negative result must be combined with clinical observations, patient history, and epidemiological information. Fact Sheet for  Patients:   StrictlyIdeas.no Fact Sheet for Healthcare Providers: BankingDealers.co.za This test is not yet approved or cleared  by the [place] FDA and has been authorized for detection and/or diagnosis of SARS-CoV-2 by FDA under an Emergency Use Authorization (EUA).  This EUA will remain in effect (meaning this test can be used) for the duration of the COVID-19 declaration under Section 564(b)(1) of the Act, 21 U.S.C. section 360bbb-3(b)(1), unless the authorization is terminated or revoked sooner. Performed at [facility], [address], [place], [place] [address]   Urine culture     Status: None   Collection Time: 04/13/20 10:27  AM   Specimen: Urine, Random  Result Value Ref Range Status   Specimen Description   Final    URINE, RANDOM Performed at [facility], [address], [place], [place] [address]    Special Requests   Final    NONE Performed at [facility], [address] [place], [place] [address]    Culture   Final    NO GROWTH Performed at [facility], [place] [address] [place],  [address]    Report Status 04/14/2020 FINAL  Final    Coagulation Studies: Recent Labs    04/14/20 0901  LABPROT 13.3  INR 1.1    Imaging: EEG  Result Date: 04/13/2020 Alexis Goodell, MD     04/13/2020  3:26 PM ELECTROENCEPHALOGRAM REPORT Patient: Herbert Spires       Room #: IC14A-AA EEG No. ID: [id number] Age: 49 y.o.        Sex: female Requesting Physician: Kasa Report Date:  04/13/2020       Interpreting Physician: Alexis Goodell History: Mardella Nuckles is an 49 y.o. female with DKA and AMS Medications: Fentanyl, Propofol, Insulin Conditions of Recording:  This is a 21 channel routine scalp EEG performed with bipolar and monopolar montages arranged in accordance to the international 10/20 system of electrode placement. One channel was dedicated to EKG recording. The patient is in the  intubated and sedated state.  Patient is positioned with her head to the left. Description: Artifact is prominent during the recording.  The background activity is discontinuous. It consists of bursts of polyspike, spike and slow wave activity alternating with periods of attenuation. The burst activity lasts up to 3 seconds and is generalized for the most part although there are some occasions when this burst activity is more prominent from the left hemisphere.  The periods of attenuation last up to 12 seconds. This discontinuous activity is maintained throughout the tracing. No activation procedures were performed IMPRESSION: This is an abnormal EEG due to a burst-suppression pattern seen throughout the tracing.  Burst suppression pattern can be seen in a variety of circumstances, including anesthesia, drug intoxication, hypothermia, as well as cerebral anoxia.  Clinical/neurological, and radiographic correlation advised.  Alexis Goodell, MD Neurology [phone] 04/13/2020, 3:19 PM   DG Abdomen 1 View  Result Date: 04/13/2020 CLINICAL DATA:  Orogastric tube placement EXAM: ABDOMEN - 1 VIEW COMPARISON:  None. FINDINGS: Orogastric tube tip and side port in stomach. Moderate stool in colon. No bowel dilatation or air-fluid level to suggest bowel obstruction. No free air. IMPRESSION: Orogastric tube tip and side port in stomach. No bowel obstruction or free air evident on supine examination. Electronically Signed   By: Lowella Grip III M.D.   On: 04/13/2020 11:12   CT HEAD WO CONTRAST  Result Date: 04/13/2020 CLINICAL DATA:  Altered mental status. The patient is unresponsive. No known injury. EXAM: CT HEAD WITHOUT CONTRAST TECHNIQUE: Contiguous axial images were obtained from the base of the skull through the vertex without intravenous contrast. COMPARISON:  None. FINDINGS: Brain: No evidence of acute infarction, hemorrhage, hydrocephalus, extra-axial collection or mass lesion/mass effect. Vascular:  Atherosclerosis noted. Skull: Intact.  No focal lesion. Sinuses/Orbits: Negative. Other: None. IMPRESSION: No acute abnormality. Atherosclerosis. Electronically Signed   By: Inge Rise M.D.   On: 04/13/2020 14:14   DG Chest Port 1 View  Result Date: 04/14/2020 CLINICAL DATA:  Central line placement, intubated EXAM: PORTABLE CHEST 1 VIEW COMPARISON:  04/13/2020 FINDINGS: Single frontal view of the chest  demonstrates stable endotracheal and enteric catheters. Left internal jugular catheter tip projects over superior vena cava. Cardiac silhouette is stable. There is persistent central vascular congestion, with developing patchy bilateral airspace disease. No effusion or pneumothorax. No acute bony abnormalities. IMPRESSION: 1. Support devices as above. 2. Progressive central vascular congestion, with developing patchy bilateral airspace disease. Findings could reflect edema or infection. Electronically Signed   By: Randa Ngo M.D.   On: 04/14/2020 01:12   DG Chest Portable 1 View  Result Date: 04/13/2020 CLINICAL DATA:  Hypoxia and confusion EXAM: PORTABLE CHEST 1 VIEW COMPARISON:  None. FINDINGS: Endotracheal tube tip is 3.2 cm above the carina. Nasogastric tube tip and side port are below the diaphragm. Side port is seen in the stomach. No pneumothorax. There is left base atelectasis. Lungs elsewhere clear. Heart is slightly enlarged with pulmonary vascularity normal. No adenopathy. No bone lesions. IMPRESSION: Tube positions as described without pneumothorax. Left base atelectasis. Lungs elsewhere clear. Heart prominent. Electronically Signed   By: Lowella Grip III M.D.   On: 04/13/2020 11:10    Medications:  I have reviewed the patient's current medications. Scheduled: . chlorhexidine gluconate (MEDLINE KIT)  15 mL Mouth Rinse BID  . Chlorhexidine Gluconate Cloth  6 each Topical Daily  . dexamethasone (DECADRON) injection  15 mg Intravenous Q6H  . docusate  100 mg Per Tube BID  .  enoxaparin (LOVENOX) injection  40 mg Subcutaneous Daily  . feeding supplement (PRO-STAT SUGAR FREE 64)  60 mL Per Tube TID  . feeding supplement (VITAL HIGH PROTEIN)  1,000 mL Per Tube Q24H  . fentaNYL (SUBLIMAZE) injection  50 mcg Intravenous Once  . insulin aspart  0-15 Units Subcutaneous Q4H  . insulin detemir  0.3 Units/kg Subcutaneous QHS  . LORazepam  4 mg Intravenous Once  . mouth rinse  15 mL Mouth Rinse 10 times per day  . [START ON 04/15/2020] multivitamin with minerals  1 tablet Per Tube Daily  . ondansetron (ZOFRAN) IV  4 mg Intravenous Once  . polyethylene glycol  17 g Per Tube Daily  . sodium chloride flush  3 mL Intravenous Q12H    Assessment/Plan: 49 y.o. female with a history of diabetes and was brought to the ED due to altered mental status. Initial blood sugar here of 589, found to be in DKA, ?sepsis.  Due to inability to protect her airway the patient was intubated and sedated.  Noted to have posturing, described as decerebrate.  Presentation very likely metabolic in etiology.  Will rule out other possible etiologies.  Head CT personally reviewed and shows no acute changes.  EEG shows burst suppression activity.  Today patient with no further posturing.  Remains intubated and sedated.  Fever overnight with no identified source.  Patient placed on broad spectrum antibiotics with CNS coverage.  LP attempted after consent from son but was unsuccessful at the bedside.     Recommendations: 1. LP under fluoro 2. Agree with addressing medical issues 3. Agree with continued broad spectrum antibiotics until results of LP available.      LOS: 1 day   Alexis Goodell, MD Neurology [phone] 04/14/2020  1:53 PM

## 2020-04-14 NOTE — Progress Notes (Signed)
WUA completed.  Medications initially turned down by 50% for 30 minutes and then medications turned off completely.  Patient woke up when the room telephone rang.  She began thrashing around in the bed, reaching for her ETT, kicking her legs, with noted purposeful movement. Her heart rate tached up into the 130s.  Patient was unable to be redirected.  Medications resumed at 100%.  Neurologist and Intensivist notified of WUA results.
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
## 2020-04-14 NOTE — Progress Notes (Signed)
Pharmacy Antibiotic Note  Jessica Santiago is a 49 y.o. female admitted on 04/13/2020 with concern for meningitis. Patient to have LP 5/14. Pharmacy has been consulted for Vancomycin and Acyclovir dosing.  Plan: Acyclovir 825 mg IV q8hrs (10 mg/kg adjusted body weight)  Change vancomycin to 1000 mg IV q12hrs. If patient continues on vancomycin, will plan to draw trough most likely on Sunday if renal function and dose remain stable.  Height: 5\' 7"  (170.2 cm) Weight: 120.2 kg (264 lb 15.9 oz) IBW/kg (Calculated) : 61.6  Temp (24hrs), Avg:98.7 F (37.1 C), Min:96.6 F (35.9 C), Max:101.3 F (38.5 C)  Recent Labs  Lab 04/13/20 0917 04/13/20 1042 04/13/20 1445 04/13/20 2114 04/14/20 0007 04/14/20 0336  WBC 20.7*  --   --   --   --  17.5*  CREATININE 1.43*  --  1.40* 1.22* 1.32* 1.20*  LATICACIDVEN 3.2* 2.4*  --   --   --   --     Estimated Creatinine Clearance: 76.9 mL/min (A) (by C-G formula based on SCr of 1.2 mg/dL (H)).    Not on File  Antimicrobials this admission: Vancomycin 5/13 >> Ceftriaxone 5/13 >> Acyclovir 5/13 >>  Dose adjustments this admission: 5/14 Vancomycin 1250 mg q12h >> 1000 mg q12h  Microbiology results: 5/13 BCx: NGTD 5/13 UCx: no growth 5/13 MRSA PCR: negative 5/14 LP: pending  Thank you for allowing pharmacy to be a part of this patient's care.  6/14, PharmD 04/14/2020 2:44 PM

## 2020-04-14 NOTE — Procedures (Signed)
Central Venous Catheter Insertion Procedure Note Jessica Santiago [id number] 03-16-1971  Procedure: Insertion of Central Venous Catheter Indications: Assessment of intravascular volume, Drug and/or fluid administration and Frequent blood sampling  Procedure Details Consent: Unable to obtain consent because of emergent medical necessity. Time Out: Verified patient identification, verified procedure, site/side was marked, verified correct patient position, special equipment/implants available, medications/allergies/relevent history reviewed, required imaging and test results available.  Performed  Maximum sterile technique was used including antiseptics, cap, gloves, gown, hand hygiene, mask and sheet. Skin prep: Chlorhexidine; local anesthetic administered A antimicrobial bonded/coated triple lumen catheter was placed in the left internal jugular vein using the Seldinger technique.  Evaluation Blood flow good Complications: No apparent complications Patient did tolerate procedure well. Chest X-ray ordered to verify placement.  CXR: pending.   Procedure was performed using Ultrasound for direct visualization of cannulization of Left IJ.  Line was secured at the 20 cm mark.  BIOPATCH applied to insertion site.    Jessica Santiago, AGACNP-BC [facility] Pager: [phone]  Jessica Santiago 04/14/2020, 12:04 AM

## 2020-04-15 LAB — CBC WITH DIFFERENTIAL/PLATELET
Abs Immature Granulocytes: 0.14 10*3/uL — ABNORMAL HIGH (ref 0.00–0.07)
Basophils Absolute: 0 10*3/uL (ref 0.0–0.1)
Basophils Relative: 0 %
Eosinophils Absolute: 0 10*3/uL (ref 0.0–0.5)
Eosinophils Relative: 0 %
HCT: 33.5 % — ABNORMAL LOW (ref 36.0–46.0)
Hemoglobin: 11 g/dL — ABNORMAL LOW (ref 12.0–15.0)
Immature Granulocytes: 1 %
Lymphocytes Relative: 10 %
Lymphs Abs: 1 10*3/uL (ref 0.7–4.0)
MCH: 30.8 pg (ref 26.0–34.0)
MCHC: 32.8 g/dL (ref 30.0–36.0)
MCV: 93.8 fL (ref 80.0–100.0)
Monocytes Absolute: 0.5 10*3/uL (ref 0.1–1.0)
Monocytes Relative: 5 %
Neutro Abs: 8.7 10*3/uL — ABNORMAL HIGH (ref 1.7–7.7)
Neutrophils Relative %: 84 %
Platelets: 253 10*3/uL (ref 150–400)
RBC: 3.57 MIL/uL — ABNORMAL LOW (ref 3.87–5.11)
RDW: 12.8 % (ref 11.5–15.5)
WBC: 10.4 10*3/uL (ref 4.0–10.5)
nRBC: 0 % (ref 0.0–0.2)

## 2020-04-15 LAB — BASIC METABOLIC PANEL
Anion gap: 7 (ref 5–15)
Anion gap: 7 (ref 5–15)
BUN: 30 mg/dL — ABNORMAL HIGH (ref 6–20)
BUN: 40 mg/dL — ABNORMAL HIGH (ref 6–20)
CO2: 24 mmol/L (ref 22–32)
CO2: 23 mmol/L (ref 22–32)
Calcium: 7.6 mg/dL — ABNORMAL LOW (ref 8.9–10.3)
Calcium: 8 mg/dL — ABNORMAL LOW (ref 8.9–10.3)
Chloride: 109 mmol/L (ref 98–111)
Chloride: 109 mmol/L (ref 98–111)
Creatinine, Ser: 0.94 mg/dL (ref 0.44–1.00)
Creatinine, Ser: 1.02 mg/dL — ABNORMAL HIGH (ref 0.44–1.00)
GFR calc Af Amer: >60 mL/min (ref >60)
GFR calc Af Amer: >60 mL/min (ref >60)
GFR calc non Af Amer: >60 mL/min (ref >60)
GFR calc non Af Amer: >60 mL/min (ref >60)
Glucose, Bld: 259 mg/dL — ABNORMAL HIGH (ref 70–99)
Glucose, Bld: 330 mg/dL — ABNORMAL HIGH (ref 70–99)
Potassium: 3.8 mmol/L (ref 3.5–5.1)
Potassium: 4.2 mmol/L (ref 3.5–5.1)
Sodium: 140 mmol/L (ref 135–145)
Sodium: 139 mmol/L (ref 135–145)

## 2020-04-15 LAB — GRAM STAIN: Gram Stain: NONE SEEN

## 2020-04-15 LAB — GLUCOSE, CAPILLARY
Glucose-Capillary: 239 mg/dL — ABNORMAL HIGH (ref 70–99)
Glucose-Capillary: 220 mg/dL — ABNORMAL HIGH (ref 70–99)
Glucose-Capillary: 225 mg/dL — ABNORMAL HIGH (ref 70–99)
Glucose-Capillary: 333 mg/dL — ABNORMAL HIGH (ref 70–99)
Glucose-Capillary: 317 mg/dL — ABNORMAL HIGH (ref 70–99)
Glucose-Capillary: 276 mg/dL — ABNORMAL HIGH (ref 70–99)

## 2020-04-15 LAB — PHOSPHORUS: Phosphorus: 2.7 mg/dL (ref 2.5–4.6)

## 2020-04-15 LAB — MAGNESIUM: Magnesium: 1.7 mg/dL (ref 1.7–2.4)

## 2020-04-15 LAB — PROCALCITONIN: Procalcitonin: 0.17 ng/mL

## 2020-04-15 MED ORDER — HYDRALAZINE HCL 20 MG/ML IJ SOLN
10.0000 mg | Freq: Once | INTRAMUSCULAR | Status: AC
  Administered 2020-04-15: 10 mg via INTRAVENOUS

## 2020-04-15 MED ORDER — INSULIN ASPART 100 UNIT/ML ~~LOC~~ SOLN
0.0000 [IU] | SUBCUTANEOUS | Status: DC
  Administered 2020-04-15: 11 [IU] via SUBCUTANEOUS
  Administered 2020-04-16 (×2): 4 [IU] via SUBCUTANEOUS
  Administered 2020-04-16: 7 [IU] via SUBCUTANEOUS
  Administered 2020-04-16 (×2): 4 [IU] via SUBCUTANEOUS
  Administered 2020-04-17: 7 [IU] via SUBCUTANEOUS
  Administered 2020-04-17: 4 [IU] via SUBCUTANEOUS
  Administered 2020-04-17: 7 [IU] via SUBCUTANEOUS
  Administered 2020-04-18 (×2): 3 [IU] via SUBCUTANEOUS
  Administered 2020-04-18 (×2): 7 [IU] via SUBCUTANEOUS
  Administered 2020-04-18 – 2020-04-19 (×2): 4 [IU] via SUBCUTANEOUS
  Administered 2020-04-19: 3 [IU] via SUBCUTANEOUS
  Administered 2020-04-19: 4 [IU] via SUBCUTANEOUS
  Administered 2020-04-20: 3 [IU] via SUBCUTANEOUS
  Administered 2020-04-20: 4 [IU] via SUBCUTANEOUS
  Administered 2020-04-20: 7 [IU] via SUBCUTANEOUS
  Filled 2020-04-15 (×23): qty 1

## 2020-04-15 MED ORDER — MAGNESIUM SULFATE 2 GM/50ML IV SOLN
2.0000 g | Freq: Once | INTRAVENOUS | Status: AC
  Administered 2020-04-15: 2 g via INTRAVENOUS
  Filled 2020-04-15: qty 50

## 2020-04-15 MED ORDER — HYDRALAZINE HCL 20 MG/ML IJ SOLN
INTRAMUSCULAR | Status: AC
  Filled 2020-04-15: qty 1

## 2020-04-15 MED ORDER — HYDRALAZINE HCL 25 MG PO TABS
25.0000 mg | ORAL_TABLET | Freq: Four times a day (QID) | ORAL | Status: DC | PRN
  Administered 2020-04-16 – 2020-04-21 (×3): 25 mg via ORAL
  Filled 2020-04-15 (×4): qty 1

## 2020-04-15 MED ORDER — DEXMEDETOMIDINE HCL IN NACL 400 MCG/100ML IV SOLN
0.4000 ug/kg/h | INTRAVENOUS | Status: DC
  Administered 2020-04-15: 0.4 ug/kg/h via INTRAVENOUS
  Administered 2020-04-15 – 2020-04-17 (×18): 1.2 ug/kg/h via INTRAVENOUS
  Administered 2020-04-18: 0.6 ug/kg/h via INTRAVENOUS
  Administered 2020-04-18: 0.5 ug/kg/h via INTRAVENOUS
  Administered 2020-04-18: 0.8 ug/kg/h via INTRAVENOUS
  Filled 2020-04-15 (×24): qty 100

## 2020-04-15 NOTE — Progress Notes (Signed)
Subjective: Patient remains intubated. On Fentanyl 234mg/hr and precedex 1.2. Not clearly following commands.  Objective: Current vital signs: BP (!) 177/94   Pulse (!) 51   Temp (!) 97.5 F (36.4 C) (Oral)   Resp 16   Ht 5' 7.01" (1.702 m)   Wt 125.8 kg   SpO2 99%   BMI 43.43 kg/m  Vital signs in last 24 hours: Temp:  [96.6 F (35.9 C)-97.8 F (36.6 C)] 97.5 F (36.4 C) (05/15 1200) Pulse Rate:  [51-78] 51 (05/15 1200) Resp:  [14-16] 16 (05/15 1200) BP: (97-177)/(60-94) 177/94 (05/15 1200) SpO2:  [96 %-100 %] 99 % (05/15 1200) FiO2 (%):  [24 %] 24 % (05/15 0803) Weight:  [125.8 kg] 125.8 kg (05/15 0500)  Intake/Output from previous day: 05/14 0701 - 05/15 0700 In: 4776.7 [I.V.:3668.4; NG/GT:274.3; IV Piggyback:833.9] Out: 1850 [Urine:1800; Emesis/NG output:50] Intake/Output this shift: Total I/O In: 1447.8 [I.V.:640.1; NG/GT:30; IV Piggyback:777.8] Out: 500 [Urine:500] Nutritional status:  Diet Order            Diet NPO time specified  Diet effective now              Neurologic Exam: Mental Status: Patient does not respond to verbal stimuli.  Does not respond to deep sternal rub.  Does not follow commands.  No verbalizations noted.  Cranial Nerves: II: patient does not respond confrontation bilaterally, pupils right 2 mm, left 2 mm,and unreactive bilaterally III,IV,VI: Oculocephalic response absent bilaterally.  V,VII: corneal reflex absent bilaterally  VIII: patient does not respond to verbal stimuli IX,X: gag reflex reduced, XI: trapezius strength unable to test bilaterally XII: tongue strength unable to test Motor: Extremities flaccid throughout.  Some decerebrate posturing noted at times Sensory: Does not respond to noxious stimuli in any extremity. Deep Tendon Reflexes:  Absent throughout. Plantars: Mute bilaterally Cerebellar: Unable to perform  Lab Results: Basic Metabolic Panel: Recent Labs  Lab 04/13/20 1445 04/13/20 1445  04/13/20 2114 04/13/20 2114 04/14/20 0007 04/14/20 0336 04/15/20 0442  NA 140  --  138  --  142 141 140  K 3.8  --  4.5  --  4.8 4.6 3.8  CL 108  --  110  --  112* 109 109  CO2 20*  --  21*  --  25 25 24   GLUCOSE 209*  --  797*  --  187* 208* 259*  BUN 31*  --  26*  --  32* 31* 30*  CREATININE 1.40*  --  1.22*  --  1.32* 1.20* 0.94  CALCIUM 7.7*   < > 7.3*   < > 7.5* 7.6* 7.6*  MG  --   --   --   --   --   --  1.7  PHOS  --   --   --   --   --   --  2.7   < > = values in this interval not displayed.    Liver Function Tests: Recent Labs  Lab 04/13/20 0917  AST 19  ALT 17  ALKPHOS 72  BILITOT 2.1*  PROT 7.1  ALBUMIN 3.4*   Recent Labs  Lab 04/13/20 0917  LIPASE 24   No results for input(s): AMMONIA in the last 168 hours.  CBC: Recent Labs  Lab 04/13/20 0917 04/14/20 0336 04/15/20 0442  WBC 20.7* 17.5* 10.4  NEUTROABS 18.2*  --  8.7*  HGB 12.9 10.3* 11.0*  HCT 38.6 30.6* 33.5*  MCV 93.2 93.0 93.8  PLT 358 263 253  Cardiac Enzymes: No results for input(s): CKTOTAL, CKMB, CKMBINDEX, TROPONINI in the last 168 hours.  Lipid Panel: Recent Labs  Lab 04/14/20 1101  TRIG 118    CBG: Recent Labs  Lab 04/14/20 2058 04/14/20 2341 04/15/20 0313 04/15/20 0827 04/15/20 1149  GLUCAP 202* 197* 239* 220* 225*    Microbiology: Results for orders placed or performed during the hospital encounter of 04/13/20  MRSA PCR Screening     Status: None   Collection Time: 04/13/20  9:14 AM   Specimen: Nasopharyngeal  Result Value Ref Range Status   MRSA by PCR NEGATIVE NEGATIVE Final    Comment:        The GeneXpert MRSA Assay (FDA approved for NASAL specimens only), is one component of a comprehensive MRSA colonization surveillance program. It is not intended to diagnose MRSA infection nor to guide or monitor treatment for MRSA infections. Performed at [facility], [address] [place], [place] [address]   Culture, blood (routine x 2)      Status: None (Preliminary result)   Collection Time: 04/13/20  9:17 AM   Specimen: BLOOD  Result Value Ref Range Status   Specimen Description BLOOD BLOOD RIGHT HAND  Final   Special Requests   Final    BOTTLES DRAWN AEROBIC AND ANAEROBIC Blood Culture adequate volume   Culture   Final    NO GROWTH 2 DAYS Performed at [facility], [address], [place], [place] [address]    Report Status PENDING  Incomplete  Culture, blood (routine x 2)     Status: None (Preliminary result)   Collection Time: 04/13/20  9:17 AM   Specimen: BLOOD  Result Value Ref Range Status   Specimen Description BLOOD BLOOD LEFT HAND  Final   Special Requests   Final    BOTTLES DRAWN AEROBIC AND ANAEROBIC Blood Culture adequate volume   Culture   Final    NO GROWTH 2 DAYS Performed at [facility], [address] [place], [place] [address]    Report Status PENDING  Incomplete  SARS Coronavirus 2 by RT PCR (hospital order, performed in [place] hospital lab) Nasopharyngeal Nasopharyngeal Swab     Status: None   Collection Time: 04/13/20  9:17 AM   Specimen: Nasopharyngeal Swab  Result Value Ref Range Status   SARS Coronavirus 2 NEGATIVE NEGATIVE Final    Comment: (NOTE) SARS-CoV-2 target nucleic acids are NOT DETECTED. The SARS-CoV-2 RNA is generally detectable in upper and lower respiratory specimens during the acute phase of infection. The lowest concentration of SARS-CoV-2 viral copies this assay can detect is 250 copies / mL. A negative result does not preclude SARS-CoV-2 infection and should not be used as the sole basis for treatment or other patient management decisions.  A negative result may occur with improper specimen collection / handling, submission of specimen other than nasopharyngeal swab, presence of viral mutation(s) within the areas targeted by this assay, and inadequate number of viral copies (<250 copies / mL). A negative result must be combined with  clinical observations, patient history, and epidemiological information. Fact Sheet for Patients:   StrictlyIdeas.no Fact Sheet for Healthcare Providers: BankingDealers.co.za This test is not yet approved or cleared  by the [place] FDA and has been authorized for detection and/or diagnosis of SARS-CoV-2 by FDA under an Emergency Use Authorization (EUA).  This EUA will remain in effect (meaning this test can be used) for the duration of the COVID-19 declaration under Section 564(b)(1) of the  Act, 21 U.S.C. section 360bbb-3(b)(1), unless the authorization is terminated or revoked sooner. Performed at [facility], [address], [place], [place] [address]   Urine culture     Status: None   Collection Time: 04/13/20 10:27 AM   Specimen: Urine, Random  Result Value Ref Range Status   Specimen Description   Final    URINE, RANDOM Performed at [facility], [address], [place], [place] [address]    Special Requests   Final    NONE Performed at [facility], [address], [place], [place] [address]    Culture   Final    NO GROWTH Performed at [facility], [place] [address], [place], [place] [address]    Report Status 04/14/2020 FINAL  Final  CSF culture     Status: None (Preliminary result)   Collection Time: 04/14/20  2:00 PM   Specimen: CSF; Cerebrospinal Fluid  Result Value Ref Range Status   Specimen Description   Final    CSF Performed at [facility], [address] [place], [place] [address]    Special Requests   Final    Normal Performed at [facility], [place], [place] [address]    Gram Stain WBC SEEN RED BLOOD CELLS NO ORGANISMS SEEN   Final   Culture   Final    NO GROWTH < 12 HOURS Performed at [facility], [place] [address] [place], [place] [address]    Report Status PENDING  Incomplete  Gram stain     Status: None  (Preliminary result)   Collection Time: 04/14/20  4:38 PM   Specimen: PATH Cytology CSF; Cerebrospinal Fluid  Result Value Ref Range Status   Specimen Description CSF  Final   Special Requests NONE  Final   Gram Stain   Final    NO ORGANISMS SEEN WBC SEEN RED BLOOD CELLS SEEN Performed at [facility], [address] [place], [place] [address]    Report Status PENDING  Incomplete    Coagulation Studies: Recent Labs    04/14/20 0901  LABPROT 13.3  INR 1.1    Imaging: EEG  Result Date: 04/13/2020 Alexis Goodell, MD     04/13/2020  3:26 PM ELECTROENCEPHALOGRAM REPORT Patient: Jessica Santiago       Room #: IC14A-AA EEG No. ID: [id number] Age: 49 y.o.        Sex: female Requesting Physician: Kasa Report Date:  04/13/2020       Interpreting Physician: Alexis Goodell History: Mayda Shippee is an 49 y.o. female with DKA and AMS Medications: Fentanyl, Propofol, Insulin Conditions of Recording:  This is a 21 channel routine scalp EEG performed with bipolar and monopolar montages arranged in accordance to the international 10/20 system of electrode placement. One channel was dedicated to EKG recording. The patient is in the intubated and sedated state.  Patient is positioned with her head to the left. Description: Artifact is prominent during the recording.  The background activity is discontinuous. It consists of bursts of polyspike, spike and slow wave activity alternating with periods of attenuation. The burst activity lasts up to 3 seconds and is generalized for the most part although there are some occasions when this burst activity is more prominent from the left hemisphere.  The periods of attenuation last up to 12 seconds. This discontinuous activity is maintained throughout the tracing. No activation procedures were performed IMPRESSION: This is an abnormal EEG due to a burst-suppression  pattern seen throughout the tracing.  Burst suppression pattern can be seen in a variety of  circumstances, including anesthesia, drug intoxication, hypothermia, as well as cerebral anoxia.  Clinical/neurological, and radiographic correlation advised.  Alexis Goodell, MD Neurology [phone] 04/13/2020, 3:19 PM   CT HEAD WO CONTRAST  Result Date: 04/13/2020 CLINICAL DATA:  Altered mental status. The patient is unresponsive. No known injury. EXAM: CT HEAD WITHOUT CONTRAST TECHNIQUE: Contiguous axial images were obtained from the base of the skull through the vertex without intravenous contrast. COMPARISON:  None. FINDINGS: Brain: No evidence of acute infarction, hemorrhage, hydrocephalus, extra-axial collection or mass lesion/mass effect. Vascular: Atherosclerosis noted. Skull: Intact.  No focal lesion. Sinuses/Orbits: Negative. Other: None. IMPRESSION: No acute abnormality. Atherosclerosis. Electronically Signed   By: Inge Rise M.D.   On: 04/13/2020 14:14   DG Chest Port 1 View  Result Date: 04/14/2020 CLINICAL DATA:  Central line placement, intubated EXAM: PORTABLE CHEST 1 VIEW COMPARISON:  04/13/2020 FINDINGS: Single frontal view of the chest demonstrates stable endotracheal and enteric catheters. Left internal jugular catheter tip projects over superior vena cava. Cardiac silhouette is stable. There is persistent central vascular congestion, with developing patchy bilateral airspace disease. No effusion or pneumothorax. No acute bony abnormalities. IMPRESSION: 1. Support devices as above. 2. Progressive central vascular congestion, with developing patchy bilateral airspace disease. Findings could reflect edema or infection. Electronically Signed   By: Randa Ngo M.D.   On: 04/14/2020 01:12   DG FL GUIDED LUMBAR PUNCTURE  Result Date: 04/14/2020 CLINICAL DATA:  Suspected meningitis. Request for diagnostic lumbar puncture under fluoroscopic guidance. EXAM: DIAGNOSTIC LUMBAR PUNCTURE UNDER FLUOROSCOPIC GUIDANCE FLUOROSCOPY TIME:  Fluoroscopy Time:  1 minutes 18 seconds Number of  Acquired Spot Images: 2 PROCEDURE: Patient is intubated. Informed consent was obtained from the patient's son prior to the procedure, including potential complications of headache, allergy, and pain. With the patient prone, the lower back was prepped with Betadine. 1% Lidocaine was used for local anesthesia. Lumbar puncture was performed at the L4-5 level with a right interlaminar approach using a 22 gauge 5 inch needle with return of initially pink tinged and subsequently clear CSF with an opening pressure of 22 cm water. 8 ml of CSF were obtained for laboratory studies. The patient tolerated the procedure well and there were no apparent complications. IMPRESSION: Technically successful diagnostic lumbar puncture under fluoroscopic guidance. Electronically Signed   By: Ilona Sorrel M.D.   On: 04/14/2020 16:56    Medications:  I have reviewed the patient's current medications. Scheduled: . hydrALAZINE      . chlorhexidine gluconate (MEDLINE KIT)  15 mL Mouth Rinse BID  . Chlorhexidine Gluconate Cloth  6 each Topical Daily  . dexamethasone (DECADRON) injection  15 mg Intravenous Q6H  . docusate  100 mg Per Tube BID  . enoxaparin (LOVENOX) injection  40 mg Subcutaneous Q12H  . feeding supplement (PRO-STAT SUGAR FREE 64)  60 mL Per Tube TID  . feeding supplement (VITAL HIGH PROTEIN)  1,000 mL Per Tube Q24H  . fentaNYL (SUBLIMAZE) injection  50 mcg Intravenous Once  . insulin aspart  0-15 Units Subcutaneous Q4H  . insulin detemir  0.3 Units/kg Subcutaneous QHS  . LORazepam  4 mg Intravenous Once  . mouth rinse  15 mL Mouth Rinse 10 times per day  . multivitamin with minerals  1 tablet Per Tube Daily  . ondansetron (ZOFRAN) IV  4 mg Intravenous Once  . polyethylene glycol  17 g Per Tube Daily  .  sodium chloride flush  3 mL Intravenous Q12H    Assessment/Plan: 49 y.o. female with a history of diabetes and was brought to the ED due to altered mental status. Initial blood sugar here of 589, found  to be in DKA, ?sepsis.  Due to inability to protect her airway the patient was intubated and sedated.  Noted to have posturing, described as decerebrate.  Presentation very likely metabolic in etiology.  Will rule out other possible etiologies.  Head CT personally reviewed and shows no acute changes.  EEG shows burst suppression activity.    - No posturing on examination today - No abnormalities on CTH - s/p LP concerning for elevated WBC of 49 followed by 10 in another tube - would continue broad spectrum antibiotics - titration sedation trials for possible extubation  - no clear focality on examination but which is limited currently - will follow  04/15/2020  12:30 PM

## 2020-04-15 NOTE — Plan of Care (Signed)

## 2020-04-15 NOTE — Progress Notes (Signed)
PHARMACY CONSULT NOTE  Pharmacy Consult for Electrolyte Monitoring and Replacement   Recent Labs: Potassium (mmol/L)  Date Value  04/15/2020 3.8   Magnesium (mg/dL)  Date Value  46/56/8127 1.7   Calcium (mg/dL)  Date Value  51/70/0174 7.6 (L)   Albumin (g/dL)  Date Value  94/49/6759 3.4 (L)   Phosphorus (mg/dL)  Date Value  16/38/4665 2.7   Sodium (mmol/L)  Date Value  04/15/2020 140   Corrected Ca: 8.1 mg/dL  Assessment: 48 year old female admitted with DKA, started on insulin drip. DKA has resolved, patient transitioned off insulin drip. Patient also with respiratory failure requiring intubation.  Pharmacy to manage electrolytes.  Goal of Therapy:  Electrolytes WNL  Plan:   MIVF: LR at 75 mL/hr  Magnesium sulfate 2 grams IV x 1  Electrolytes WNL: no further replacement required  follow up with morning labs  Lowella Bandy ,PharmD Clinical Pharmacist 04/15/2020 11:57 AM

## 2020-04-15 NOTE — Progress Notes (Signed)
Titrated Propofol down,  Now stopped. Started precedex, currently at 1.2. Titrated Fentanyl to 175. Pt responsive to pain. During titration pt did become more responsive and sat up and lifted arms. Pt has remained Sinus [place]on Foley throughout shift. Urine output has decreased throughout shift. Bear hugger applied due to unmeasurable temp. Currently pt at 35.8C. Pts BP has steadily climbed up to the 180's. Adm one time dose of hydralazine brought BP WNL. Pts BP again steadily climbing currently 152/80. BS has steadily been climbing as well, Adm levemir early per Dr. Belia Heman. Along with sliding scale insulin. Dr. Belia Heman has been made aware of all developments throughout the shift. Pts sister and son have been updated via phone.

## 2020-04-15 NOTE — Progress Notes (Signed)
CRITICAL CARE NOTE  49 y.o.femalewith a history of diabetes who is brought to the ED due to altered mental status.  She was found on the floor at home covered in feces urine and vomit by her boyfriend.  EMS report the patient is confused, agitated, unable to provide history or engage in medical care. Blood sugar on scene read as "high." Patient with acute resp failure Acute toxic metabolic encephalopathy Intubated for severe resp failure Patient arrived to ICU with severe resp failure and postural decortication  SIGNIFICANT EVENTS 5/13 intubated, sedated, DKA admitted for resp failure 5/14 CVL placed, on pressors, weaned off Insulin drip 5/15 failed weaning trials due to severe encephalopathy, started on precedex   CC  follow up respiratory failure  SUBJECTIVE Patient remains critically ill Prognosis is guarded   BP (!) 161/90 (BP Location: Left Arm)   Pulse (!) 55   Temp (!) 97.5 F (36.4 C) (Oral)   Resp 16   Ht 5' 7.01" (1.702 m)   Wt 125.8 kg   SpO2 98%   BMI 43.43 kg/m    I/O last 3 completed shifts: In: 9932.8 [I.V.:5037.5; NG/GT:274.3; IV Piggyback:4621] Out: 2170 [Urine:1920; Emesis/NG output:250] Total I/O In: 922.5 [I.V.:342.8; NG/GT:30; IV Piggyback:549.7] Out: 500 [Urine:500]  SpO2: 98 % FiO2 (%): 24 %  Estimated body mass index is 43.43 kg/m as calculated from the following:   Height as of this encounter: 5' 7.01" (1.702 m).   Weight as of this encounter: 125.8 kg.  SIGNIFICANT EVENTS   REVIEW OF SYSTEMS  PATIENT IS UNABLE TO PROVIDE COMPLETE REVIEW OF SYSTEMS DUE TO SEVERE CRITICAL ILLNESS        PHYSICAL EXAMINATION:  GENERAL:critically ill appearing, +resp distress HEAD: Normocephalic, atraumatic.  EYES: Pupils equal, round, reactive to light.  No scleral icterus.  MOUTH: Moist mucosal membrane. NECK: Supple.  PULMONARY: +rhonchi, +wheezing CARDIOVASCULAR: S1 and S2. Regular rate and rhythm. No murmurs, rubs, or gallops.   GASTROINTESTINAL: Soft, nontender, -distended.  Positive bowel sounds.   MUSCULOSKELETAL:+edema.  NEUROLOGIC: obtunded, GCS<8 SKIN:intact,warm,dry  MEDICATIONS: I have reviewed all medications and confirmed regimen as documented   CULTURE RESULTS   Recent Results (from the past 240 hour(s))  MRSA PCR Screening     Status: None   Collection Time: 04/13/20  9:14 AM   Specimen: Nasopharyngeal  Result Value Ref Range Status   MRSA by PCR NEGATIVE NEGATIVE Final    Comment:        The GeneXpert MRSA Assay (FDA approved for NASAL specimens only), is one component of a comprehensive MRSA colonization surveillance program. It is not intended to diagnose MRSA infection nor to guide or monitor treatment for MRSA infections. Performed at [facility], [address] [place], [place] [address]   Culture, blood (routine x 2)     Status: None (Preliminary result)   Collection Time: 04/13/20  9:17 AM   Specimen: BLOOD  Result Value Ref Range Status   Specimen Description BLOOD BLOOD RIGHT HAND  Final   Special Requests   Final    BOTTLES DRAWN AEROBIC AND ANAEROBIC Blood Culture adequate volume   Culture   Final    NO GROWTH 2 DAYS Performed at [facility], [address] [place], [place] [address]    Report Status PENDING  Incomplete  Culture, blood (routine x 2)     Status: None (Preliminary result)   Collection Time: 04/13/20  9:17 AM   Specimen: BLOOD  Result Value Ref Range Status  Specimen Description BLOOD BLOOD LEFT HAND  Final   Special Requests   Final    BOTTLES DRAWN AEROBIC AND ANAEROBIC Blood Culture adequate volume   Culture   Final    NO GROWTH 2 DAYS Performed at [facility], [address] [place], [place] [address]    Report Status PENDING  Incomplete  SARS Coronavirus 2 by RT PCR (hospital order, performed in [facility] hospital lab) Nasopharyngeal Nasopharyngeal Swab     Status: None   Collection Time: 04/13/20  9:17 AM    Specimen: Nasopharyngeal Swab  Result Value Ref Range Status   SARS Coronavirus 2 NEGATIVE NEGATIVE Final    Comment: (NOTE) SARS-CoV-2 target nucleic acids are NOT DETECTED. The SARS-CoV-2 RNA is generally detectable in upper and lower respiratory specimens during the acute phase of infection. The lowest concentration of SARS-CoV-2 viral copies this assay can detect is 250 copies / mL. A negative result does not preclude SARS-CoV-2 infection and should not be used as the sole basis for treatment or other patient management decisions.  A negative result may occur with improper specimen collection / handling, submission of specimen other than nasopharyngeal swab, presence of viral mutation(s) within the areas targeted by this assay, and inadequate number of viral copies (<250 copies / mL). A negative result must be combined with clinical observations, patient history, and epidemiological information. Fact Sheet for Patients:   StrictlyIdeas.no Fact Sheet for Healthcare Providers: BankingDealers.co.za This test is not yet approved or cleared  by the [place] FDA and has been authorized for detection and/or diagnosis of SARS-CoV-2 by FDA under an Emergency Use Authorization (EUA).  This EUA will remain in effect (meaning this test can be used) for the duration of the COVID-19 declaration under Section 564(b)(1) of the Act, 21 U.S.C. section 360bbb-3(b)(1), unless the authorization is terminated or revoked sooner. Performed at [facility], [address], [place], [place] [address]   Urine culture     Status: None   Collection Time: 04/13/20 10:27 AM   Specimen: Urine, Random  Result Value Ref Range Status   Specimen Description   Final    URINE, RANDOM Performed at [facility], [address] [place], [place] [address]    Special Requests   Final    NONE Performed at [facility], [address] [place], [place] [address]    Culture   Final    NO GROWTH Performed at [facility], [place] [address] [place], [place] [address]    Report Status 04/14/2020 FINAL  Final  CSF culture     Status: None (Preliminary result)   Collection Time: 04/14/20  2:00 PM   Specimen: CSF; Cerebrospinal Fluid  Result Value Ref Range Status   Specimen Description   Final    CSF Performed at [facility], [address] [place], [place] [address]    Special Requests   Final    Normal Performed at [facility], [place], [place] [address]    Gram Stain WBC SEEN RED BLOOD CELLS NO ORGANISMS SEEN   Final   Culture   Final    NO GROWTH < 12 HOURS Performed at [facility], [place] [address], [place], [place] [address]    Report Status PENDING  Incomplete  Gram stain     Status: None (Preliminary result)   Collection Time: 04/14/20  4:38 PM   Specimen: PATH Cytology CSF; Cerebrospinal Fluid  Result  Value Ref Range Status   Specimen Description CSF  Final   Special Requests NONE  Final   Gram Stain   Final    NO ORGANISMS SEEN WBC SEEN RED BLOOD CELLS SEEN Performed at [facility], [address], [place], [place] [address]    Report Status PENDING  Incomplete          IMAGING    DG FL GUIDED LUMBAR PUNCTURE  Result Date: 04/14/2020 CLINICAL DATA:  Suspected meningitis. Request for diagnostic lumbar puncture under fluoroscopic guidance. EXAM: DIAGNOSTIC LUMBAR PUNCTURE UNDER FLUOROSCOPIC GUIDANCE FLUOROSCOPY TIME:  Fluoroscopy Time:  1 minutes 18 seconds Number of Acquired Spot Images: 2 PROCEDURE: Patient is intubated. Informed consent was obtained from the patient's son prior to the procedure, including potential complications of headache, allergy, and pain. With the patient prone, the lower back was prepped with Betadine. 1% Lidocaine was used for local anesthesia. Lumbar puncture was performed at the L4-5 level with a right  interlaminar approach using a 22 gauge 5 inch needle with return of initially pink tinged and subsequently clear CSF with an opening pressure of 22 cm water. 8 ml of CSF were obtained for laboratory studies. The patient tolerated the procedure well and there were no apparent complications. IMPRESSION: Technically successful diagnostic lumbar puncture under fluoroscopic guidance. Electronically Signed   By: Ilona Sorrel M.D.   On: 04/14/2020 16:56     Nutrition Status: Nutrition Problem: Inadequate oral intake Etiology: inability to eat Signs/Symptoms: NPO status Interventions: Tube feeding, Prostat, MVI     Indwelling Urinary Catheter continued, requirement due to   Reason to continue Indwelling Urinary Catheter strict Intake/Output monitoring for hemodynamic instability   Central Line/ continued, requirement due to  Reason to continue [place] of central venous pressure or other hemodynamic parameters and poor IV access   Ventilator continued, requirement due to severe respiratory failure   Ventilator Sedation RASS 0 to -2      ASSESSMENT AND PLAN SYNOPSIS  Severe ACUTE Hypoxic and Hypercapnic Respiratory Failuredue severe acidosis due to DKA with toxic metabolic encephalopathy complicated by fevers ?etiology probable c/w CAP SEPSIS WITH PNEUMONIA  Severe ACUTE Hypoxic and Hypercapnic Respiratory Failure -continue Full MV support -continue Bronchodilator Therapy -Wean Fio2 and PEEP as tolerated -will perform SAT/SBT when respiratory parameters are met -VAP/VENT bundle implementation  Morbid obesity, possible OSA.   Will certainly impact respiratory mechanics, ventilator weaning Suspect will need to consider additional PEEP, possible extubation to BiPAP when appropriate to consider   ACUTE KIDNEY INJURY/Renal Failure -continue Foley Catheter-assess need -Avoid nephrotoxic agents -Follow urine output, BMP -Ensure adequate renal perfusion, optimize  oxygenation -Renal dose medications     NEUROLOGY - intubated and sedated - minimal sedation to achieve a RASS goal: -1 Wake up assessment pending  Acute toxic metabolic encephalopathy, need for sedation   CARDIAC ICU monitoring  ID -continue IV abx as prescibed -follow up cultures  GI GI PROPHYLAXIS as indicated  NUTRITIONAL STATUS Nutrition Status: Nutrition Problem: Inadequate oral intake Etiology: inability to eat Signs/Symptoms: NPO status Interventions: Tube feeding, Prostat, MVI   DIET-->TF's as tolerated Constipation protocol as indicated  ENDO - will use ICU hypoglycemic\Hyperglycemia protocol if indicated     ELECTROLYTES -follow labs as needed -replace as needed -pharmacy consultation and following   DVT/GI PRX ordered and assessed TRANSFUSIONS AS NEEDED MONITOR FSBS I Assessed the need for Labs I Assessed the need for Foley I Assessed the need for Central Venous Line Family Discussion when  available I Assessed the need for Mobilization I made an Assessment of medications to be adjusted accordingly Safety Risk assessment completed   CASE DISCUSSED IN MULTIDISCIPLINARY ROUNDS WITH ICU TEAM  Critical Care Time devoted to patient care services described in this note is 42 minutes.   Overall, patient is critically ill, prognosis is guarded.  Patient with Multiorgan failure and at high risk for cardiac arrest and death.    Corrin Wauneka, M.D.  [facility]  Medical Director [place] Director [facility] Cardio-Pulmonary Department

## 2020-04-16 DIAGNOSIS — A419 Sepsis, unspecified organism: Principal | ICD-10-CM

## 2020-04-16 DIAGNOSIS — R6521 Severe sepsis with septic shock: Secondary | ICD-10-CM

## 2020-04-16 DIAGNOSIS — G039 Meningitis, unspecified: Secondary | ICD-10-CM

## 2020-04-16 LAB — GLUCOSE, CAPILLARY
Glucose-Capillary: 151 mg/dL — ABNORMAL HIGH (ref 70–99)
Glucose-Capillary: 159 mg/dL — ABNORMAL HIGH (ref 70–99)
Glucose-Capillary: 161 mg/dL — ABNORMAL HIGH (ref 70–99)
Glucose-Capillary: 173 mg/dL — ABNORMAL HIGH (ref 70–99)
Glucose-Capillary: 214 mg/dL — ABNORMAL HIGH (ref 70–99)
Glucose-Capillary: 266 mg/dL — ABNORMAL HIGH (ref 70–99)

## 2020-04-16 LAB — BASIC METABOLIC PANEL
Anion gap: 6 (ref 5–15)
Anion gap: 7 (ref 5–15)
BUN: 43 mg/dL — ABNORMAL HIGH (ref 6–20)
BUN: 43 mg/dL — ABNORMAL HIGH (ref 6–20)
CO2: 24 mmol/L (ref 22–32)
CO2: 21 mmol/L — ABNORMAL LOW (ref 22–32)
Calcium: 8 mg/dL — ABNORMAL LOW (ref 8.9–10.3)
Calcium: 7.5 mg/dL — ABNORMAL LOW (ref 8.9–10.3)
Chloride: 110 mmol/L (ref 98–111)
Chloride: 112 mmol/L — ABNORMAL HIGH (ref 98–111)
Creatinine, Ser: 1.1 mg/dL — ABNORMAL HIGH (ref 0.44–1.00)
Creatinine, Ser: 0.9 mg/dL (ref 0.44–1.00)
GFR calc Af Amer: >60 mL/min (ref >60)
GFR calc Af Amer: >60 mL/min (ref >60)
GFR calc non Af Amer: 59 mL/min — ABNORMAL LOW (ref >60)
GFR calc non Af Amer: >60 mL/min (ref >60)
Glucose, Bld: 195 mg/dL — ABNORMAL HIGH (ref 70–99)
Glucose, Bld: 170 mg/dL — ABNORMAL HIGH (ref 70–99)
Potassium: 3.9 mmol/L (ref 3.5–5.1)
Potassium: 3.6 mmol/L (ref 3.5–5.1)
Sodium: 140 mmol/L (ref 135–145)
Sodium: 140 mmol/L (ref 135–145)

## 2020-04-16 LAB — CBC WITH DIFFERENTIAL/PLATELET
Abs Immature Granulocytes: 0.06 10*3/uL (ref 0.00–0.07)
Basophils Absolute: 0 10*3/uL (ref 0.0–0.1)
Basophils Relative: 0 %
Eosinophils Absolute: 0 10*3/uL (ref 0.0–0.5)
Eosinophils Relative: 0 %
HCT: 40.2 % (ref 36.0–46.0)
Hemoglobin: 13.1 g/dL (ref 12.0–15.0)
Immature Granulocytes: 1 %
Lymphocytes Relative: 10 %
Lymphs Abs: 1 10*3/uL (ref 0.7–4.0)
MCH: 30.4 pg (ref 26.0–34.0)
MCHC: 32.6 g/dL (ref 30.0–36.0)
MCV: 93.3 fL (ref 80.0–100.0)
Monocytes Absolute: 0.9 10*3/uL (ref 0.1–1.0)
Monocytes Relative: 9 %
Neutro Abs: 8 10*3/uL — ABNORMAL HIGH (ref 1.7–7.7)
Neutrophils Relative %: 80 %
Platelets: 291 10*3/uL (ref 150–400)
RBC: 4.31 MIL/uL (ref 3.87–5.11)
RDW: 12.9 % (ref 11.5–15.5)
WBC: 10 10*3/uL (ref 4.0–10.5)
nRBC: 0 % (ref 0.0–0.2)

## 2020-04-16 LAB — MAGNESIUM
Magnesium: 2.4 mg/dL (ref 1.7–2.4)
Magnesium: 2.3 mg/dL (ref 1.7–2.4)

## 2020-04-16 LAB — PHOSPHORUS
Phosphorus: 3.4 mg/dL (ref 2.5–4.6)
Phosphorus: 3.2 mg/dL (ref 2.5–4.6)

## 2020-04-16 MED ORDER — LORAZEPAM 2 MG/ML IJ SOLN
2.0000 mg | INTRAMUSCULAR | Status: DC | PRN
  Administered 2020-04-16 – 2020-04-18 (×3): 2 mg via INTRAVENOUS
  Filled 2020-04-16 (×4): qty 1

## 2020-04-16 MED ORDER — LACTATED RINGERS IV BOLUS
1000.0000 mL | Freq: Once | INTRAVENOUS | Status: AC
  Administered 2020-04-16: 1000 mL via INTRAVENOUS

## 2020-04-16 MED ORDER — AMLODIPINE BESYLATE 5 MG PO TABS
5.0000 mg | ORAL_TABLET | Freq: Every day | ORAL | Status: DC
  Administered 2020-04-16: 5 mg
  Filled 2020-04-16 (×3): qty 1

## 2020-04-16 MED ORDER — HYDRALAZINE HCL 20 MG/ML IJ SOLN
10.0000 mg | Freq: Once | INTRAMUSCULAR | Status: AC
  Administered 2020-04-16: 10 mg via INTRAVENOUS
  Filled 2020-04-16: qty 1

## 2020-04-16 MED ORDER — VANCOMYCIN HCL 10 G IV SOLR
1750.0000 mg | INTRAVENOUS | Status: DC
  Administered 2020-04-16: 1750 mg via INTRAVENOUS
  Filled 2020-04-16 (×2): qty 1750

## 2020-04-16 NOTE — Progress Notes (Signed)
CRITICAL CARE NOTE 49 y.o.femalewith a history of diabetes who is brought to the ED due to altered mental status.  She was found on the floor at home covered in feces urine and vomit by her boyfriend.  EMS report the patient is confused, agitated, unable to provide history or engage in medical care. Blood sugar on scene read as "high." Patient with acute resp failure Acute toxic metabolic encephalopathy Intubated for severe resp failure Patient arrived to ICU with severe resp failure and postural decortication  SIGNIFICANT EVENTS 5/13 intubated, sedated, DKA admitted for resp failure 5/14 CVL placed, on pressors, weaned off Insulin drip 5/15 failed weaning trials due to severe encephalopathy, started on precedex 5/16 Meningitis/encephalitis  CC  follow up respiratory failure  SUBJECTIVE Patient remains critically ill Prognosis is guarded multiorgan failure    BP 129/76   Pulse (!) 52   Temp (!) 97.5 F (36.4 C) (Axillary)   Resp 16   Ht 5' 7.01" (1.702 m)   Wt 125.8 kg   SpO2 100%   BMI 43.43 kg/m    I/O last 3 completed shifts: In: 5154.8 [I.V.:3348.5; NG/GT:284.3; IV Piggyback:1522] Out: 2040 [Urine:2040] Total I/O In: 98.2 [I.V.:98.2] Out: 275 [Urine:275]  SpO2: 100 % FiO2 (%): (S) 35 %  Estimated body mass index is 43.43 kg/m as calculated from the following:   Height as of this encounter: 5' 7.01" (1.702 m).   Weight as of this encounter: 125.8 kg.  SIGNIFICANT EVENTS   REVIEW OF SYSTEMS  PATIENT IS UNABLE TO PROVIDE COMPLETE REVIEW OF SYSTEMS DUE TO SEVERE CRITICAL ILLNESS        PHYSICAL EXAMINATION:  GENERAL:critically ill appearing, +resp distress HEAD: Normocephalic, atraumatic.  EYES: Pupils equal, round, reactive to light.  No scleral icterus.  MOUTH: Moist mucosal membrane. NECK: Supple.  PULMONARY: +rhonchi, +wheezing CARDIOVASCULAR: S1 and S2. Regular rate and rhythm. No murmurs, rubs, or gallops.  GASTROINTESTINAL: Soft,  nontender, -distended.  Positive bowel sounds.   MUSCULOSKELETAL: No swelling, clubbing, or edema.  NEUROLOGIC: obtunded, GCS<8 SKIN:intact,warm,dry  MEDICATIONS: I have reviewed all medications and confirmed regimen as documented   CULTURE RESULTS   Recent Results (from the past 240 hour(s))  MRSA PCR Screening     Status: None   Collection Time: 04/13/20  9:14 AM   Specimen: Nasopharyngeal  Result Value Ref Range Status   MRSA by PCR NEGATIVE NEGATIVE Final    Comment:        The GeneXpert MRSA Assay (FDA approved for NASAL specimens only), is one component of a comprehensive MRSA colonization surveillance program. It is not intended to diagnose MRSA infection nor to guide or monitor treatment for MRSA infections. Performed at [facility], [address] [place], [place] [address]   Culture, blood (routine x 2)     Status: None (Preliminary result)   Collection Time: 04/13/20  9:17 AM   Specimen: BLOOD  Result Value Ref Range Status   Specimen Description BLOOD BLOOD RIGHT HAND  Final   Special Requests   Final    BOTTLES DRAWN AEROBIC AND ANAEROBIC Blood Culture adequate volume   Culture   Final    NO GROWTH 3 DAYS Performed at [facility], [address] [place],  [address]    Report Status PENDING  Incomplete  Culture, blood (routine x 2)     Status: None (Preliminary result)   Collection Time: 04/13/20  9:17 AM   Specimen: BLOOD  Result Value Ref Range Status   Specimen  Description BLOOD BLOOD LEFT HAND  Final   Special Requests   Final    BOTTLES DRAWN AEROBIC AND ANAEROBIC Blood Culture adequate volume   Culture   Final    NO GROWTH 3 DAYS Performed at [facility], [address], [place], [place] [address]    Report Status PENDING  Incomplete  SARS Coronavirus 2 by RT PCR (hospital order, performed in [facility] hospital lab) Nasopharyngeal Nasopharyngeal Swab     Status: None   Collection Time: 04/13/20  9:17 AM    Specimen: Nasopharyngeal Swab  Result Value Ref Range Status   SARS Coronavirus 2 NEGATIVE NEGATIVE Final    Comment: (NOTE) SARS-CoV-2 target nucleic acids are NOT DETECTED. The SARS-CoV-2 RNA is generally detectable in upper and lower respiratory specimens during the acute phase of infection. The lowest concentration of SARS-CoV-2 viral copies this assay can detect is 250 copies / mL. A negative result does not preclude SARS-CoV-2 infection and should not be used as the sole basis for treatment or other patient management decisions.  A negative result may occur with improper specimen collection / handling, submission of specimen other than nasopharyngeal swab, presence of viral mutation(s) within the areas targeted by this assay, and inadequate number of viral copies (<250 copies / mL). A negative result must be combined with clinical observations, patient history, and epidemiological information. Fact Sheet for Patients:   StrictlyIdeas.no Fact Sheet for Healthcare Providers: BankingDealers.co.za This test is not yet approved or cleared  by the [place] FDA and has been authorized for detection and/or diagnosis of SARS-CoV-2 by FDA under an Emergency Use Authorization (EUA).  This EUA will remain in effect (meaning this test can be used) for the duration of the COVID-19 declaration under Section 564(b)(1) of the Act, 21 U.S.C. section 360bbb-3(b)(1), unless the authorization is terminated or revoked sooner. Performed at [facility], [address] [place], [place] [address]   Urine culture     Status: None   Collection Time: 04/13/20 10:27 AM   Specimen: Urine, Random  Result Value Ref Range Status   Specimen Description   Final    URINE, RANDOM Performed at [facility], [address] [place], [place] [address]    Special Requests   Final    NONE Performed at [facility], [address] [place], [place] [address]    Culture   Final    NO GROWTH Performed at [facility], [place] [address] [place], [place] [address]    Report Status 04/14/2020 FINAL  Final  CSF culture     Status: None (Preliminary result)   Collection Time: 04/14/20  2:00 PM   Specimen: CSF; Cerebrospinal Fluid  Result Value Ref Range Status   Specimen Description   Final    CSF Performed at [facility], [address], [place], [place] [address]    Special Requests   Final    Normal Performed at [facility], [place], [place] [address]    Gram Stain WBC SEEN RED BLOOD CELLS NO ORGANISMS SEEN   Final   Culture   Final    NO GROWTH 2 DAYS Performed at [facility], [place] [address] [place],  [address]    Report Status PENDING  Incomplete  Anaerobic culture     Status: None (Preliminary result)   Collection Time: 04/14/20  4:38 PM   Specimen: PATH Cytology CSF; Cerebrospinal Fluid  Result Value Ref  Range Status   Specimen Description   Final    CSF Performed at [facility], [address] [place], [place] [address]    Special Requests   Final    NONE Performed at [facility], [address] [place], [place] [address]    Culture   Final    NO ANAEROBES ISOLATED; CULTURE IN PROGRESS FOR 5 DAYS   Report Status PENDING  Incomplete  Gram stain     Status: None   Collection Time: 04/14/20  4:38 PM   Specimen: PATH Cytology CSF; Cerebrospinal Fluid  Result Value Ref Range Status   Specimen Description CSF  Final   Special Requests NONE  Final   Gram Stain   Final    NO ORGANISMS SEEN WBC SEEN RED BLOOD CELLS SEEN Performed at [facility], [address], [place], [place] [address]    Report Status 04/15/2020 FINAL  Final  Culture, fungus without smear     Status: None (Preliminary result)   Collection Time: 04/14/20  4:38 PM   Specimen: PATH Cytology CSF; Cerebrospinal Fluid  Result Value Ref  Range Status   Specimen Description   Final    CSF Performed at [facility], [address], [place], [place] [address]    Special Requests   Final    NONE Performed at [facility], [address] [place],  [address]    Culture   Final    NO FUNGUS ISOLATED AFTER 1 DAY Performed at [facility], [place] [address], [place], [place] [address]    Report Status PENDING  Incomplete        CBC    Component Value Date/Time   WBC 10.0 04/16/2020 0458   RBC 4.31 04/16/2020 0458   HGB 13.1 04/16/2020 0458   HCT 40.2 04/16/2020 0458   PLT 291 04/16/2020 0458   MCV 93.3 04/16/2020 0458   MCH 30.4 04/16/2020 0458   MCHC 32.6 04/16/2020 0458   RDW 12.9 04/16/2020 0458   LYMPHSABS 1.0 04/16/2020 0458   MONOABS 0.9 04/16/2020 0458   EOSABS 0.0 04/16/2020 0458   BASOSABS 0.0 04/16/2020 0458   BMP Latest Ref Rng & Units 04/16/2020 04/15/2020 04/15/2020  Glucose 70 - 99 mg/dL 195(H) 330(H) 259(H)  BUN 6 - 20 mg/dL 43(H) 40(H) 30(H)  Creatinine 0.44 - 1.00 mg/dL 1.10(H) 1.02(H) 0.94  Sodium 135 - 145 mmol/L 140 139 140  Potassium 3.5 - 5.1 mmol/L 3.9 4.2 3.8  Chloride 98 - 111 mmol/L 110 109 109  CO2 22 - 32 mmol/L 24 23 24   Calcium 8.9 - 10.3 mg/dL 8.0(L) 8.0(L) 7.6(L)      IMAGING    No results found.   Nutrition Status: Nutrition Problem: Inadequate oral intake Etiology: inability to eat Signs/Symptoms: NPO status Interventions: Tube feeding, Prostat, MVI     Indwelling Urinary Catheter continued, requirement due to   Reason to continue Indwelling Urinary Catheter strict Intake/Output monitoring for hemodynamic instability   Central Line/ continued, requirement due to  Reason to continue [place] of central venous pressure or other hemodynamic parameters and poor IV access   Ventilator continued, requirement due to severe respiratory failure   Ventilator Sedation RASS 0 to -2      ASSESSMENT AND  PLAN SYNOPSIS  Severe ACUTE Hypoxic and Hypercapnic Respiratory Failuredue severe acidosis due to DKA with toxic metabolic encephalopathycomplicated by fevers ?etiology probable c/w CAP SEPSIS WITH PNEUMONIA with MENINGITIS/ENCEPHALITIS   Severe ACUTE  Hypoxic and Hypercapnic Respiratory Failure -continue Full MV support -continue Bronchodilator Therapy -Wean Fio2 and PEEP as tolerated -will perform SAT/SBT when respiratory parameters are met -VAP/VENT bundle implementation   Morbid obesity, possible OSA.   Will certainly impact respiratory mechanics, ventilator weaning Suspect will need to consider additional PEEP, possible extubation to BiPAP when appropriate to consider   ACUTE KIDNEY INJURY/Renal Failure -continue Foley Catheter-assess need -Avoid nephrotoxic agents -Follow urine output, BMP -Ensure adequate renal perfusion, optimize oxygenation -Renal dose medications     NEUROLOGY - intubated and sedated Acute toxic metabolic encephalopathy, need for sedation Goal RASS -2 to -3   CARDIAC ICU monitoring  ID DX of CAP and MENINGITIS AND ENCEPHALITIS -continue IV abx as prescibed -follow up cultures  GI GI PROPHYLAXIS as indicated  NUTRITIONAL STATUS Nutrition Status: Nutrition Problem: Inadequate oral intake Etiology: inability to eat Signs/Symptoms: NPO status Interventions: Tube feeding, Prostat, MVI   DIET-->TF's as tolerated Constipation protocol as indicated  ENDO - will use ICU hypoglycemic\Hyperglycemia protocol if indicated     ELECTROLYTES -follow labs as needed -replace as needed -pharmacy consultation and following   DVT/GI PRX ordered and assessed TRANSFUSIONS AS NEEDED MONITOR FSBS I Assessed the need for Labs I Assessed the need for Foley I Assessed the need for Central Venous Line Family Discussion when available I Assessed the need for Mobilization I made an Assessment of medications to be adjusted accordingly Safety  Risk assessment completed   CASE DISCUSSED IN MULTIDISCIPLINARY ROUNDS WITH ICU TEAM  Critical Care Time devoted to patient care services described in this note is 43 minutes.   Overall, patient is critically ill, prognosis is guarded.  Patient with Multiorgan failure and at high risk for cardiac arrest and death.    Corrin Wickersham, M.D.  [facility]  Medical Director [place] Director [facility] Cardio-Pulmonary Department

## 2020-04-16 NOTE — Progress Notes (Signed)
WUA completed. Pt initially followed commands, able to squeeze hands and wiggle toes on command. Pt later became combative, flailing arms and slamming arms on bed, attempting to pull off mitts and pull at ETT. RN unable to calm pt. Sedation resumed and PRN versed administered for pt safety.

## 2020-04-16 NOTE — Progress Notes (Signed)
Pharmacy Antibiotic Note  Jessica Santiago is a 49 y.o. female admitted on 04/13/2020 with concern for meningitis. S/P LP concerning for elevated WBC of 49 followed by 10 in another tube. She remains mechanically ventilated. Pharmacy was consulted for vancomycin and acyclovir dosing. This is day  #4 of broad-spectrum IV antibiotics, leukocytosis has resolved but SCr has bumped up slightly today  Plan: 1) continue acyclovir 825 mg IV q8hrs (10 mg/kg adjusted body weight)  2) change vancomycin to 1750 mg IV q24hrs Goal AUC 400-550 Expected AUC: 506 SCr used: 1.10 T1/2: 12.6h Css (calculated): 37.9/11.3 mcg/mL  Height: 5' 7.01" (170.2 cm) Weight: 125.8 kg (277 lb 5.4 oz) IBW/kg (Calculated) : 61.62  Temp (24hrs), Avg:96.3 F (35.7 C), Min:95 F (35 C), Max:97.5 F (36.4 C)  Recent Labs  Lab 04/13/20 0917 04/13/20 1042 04/13/20 1445 04/14/20 0007 04/14/20 0336 04/15/20 0442 04/15/20 1938 04/16/20 0458  WBC 20.7*  --   --   --  17.5* 10.4  --  10.0  CREATININE 1.43*  --    < > 1.32* 1.20* 0.94 1.02* 1.10*  LATICACIDVEN 3.2* 2.4*  --   --   --   --   --   --    < > = values in this interval not displayed.    Estimated Creatinine Clearance: 86.2 mL/min (A) (by C-G formula based on SCr of 1.1 mg/dL (H)).    Not on File  Antimicrobials this admission: Vancomycin 5/13 >> Ceftriaxone 5/13 >> Acyclovir 5/13 >>  Dose adjustments this admission: 5/14 Vancomycin 1250 mg q12h >> 1000 mg q12h 5/16 vancomycin 1000 mg q12h >> 1750 mg q 24h  Microbiology results: 5/13 BCx: NGTD 5/13 UCx: no growth 5/13 MRSA PCR: negative 5/14 LP: WBC 49  Thank you for allowing pharmacy to be a part of this patient's care.  Lowella Bandy, PharmD 04/16/2020 11:42 AM

## 2020-04-16 NOTE — Progress Notes (Signed)
MAP trending down, Dr Belia Heman made aware. CVP monitor placed. CVP 7-8. Verbal order for 1 L LR bolus. Dr. Belia Heman made aware of new ventricular bigeminy with rate 105. New orders for labs. Blood for Magnesium, Phosphorus, and BMP sent to lab. Temperature normalized and bare hugger removed. Pt converted to ST 105 after approximately 20 min. Will continue to monitor.

## 2020-04-16 NOTE — Progress Notes (Signed)
Temp 94.1 F. Bare Hugger applied. Will continue to monitor and assess.

## 2020-04-16 NOTE — Progress Notes (Signed)
PHARMACY CONSULT NOTE  Pharmacy Consult for Electrolyte Monitoring and Replacement   Recent Labs: Potassium (mmol/L)  Date Value  04/16/2020 3.9   Magnesium (mg/dL)  Date Value  22/48/2500 2.4   Calcium (mg/dL)  Date Value  37/03/8888 8.0 (L)   Albumin (g/dL)  Date Value  16/94/5038 3.4 (L)   Phosphorus (mg/dL)  Date Value  88/28/0034 3.4   Sodium (mmol/L)  Date Value  04/16/2020 140   Corrected Ca: 8.5 mg/dL  Assessment: 49 year old female admitted with DKA, started on insulin drip. DKA has resolved, patient transitioned off insulin drip. Patient also with respiratory failure requiring intubation.  Pharmacy to manage electrolytes.  Goal of Therapy:  Electrolytes WNL  Plan:   MIVF: LR at 75 mL/hr  Electrolytes WNL: no further replacement required  follow up with morning labs  Lowella Bandy ,PharmD Clinical Pharmacist 04/16/2020 11:39 AM

## 2020-04-17 LAB — CBC WITH DIFFERENTIAL/PLATELET
Abs Immature Granulocytes: 0.33 10*3/uL — ABNORMAL HIGH (ref 0.00–0.07)
Basophils Absolute: 0.1 10*3/uL (ref 0.0–0.1)
Basophils Relative: 0 %
Eosinophils Absolute: 0.3 10*3/uL (ref 0.0–0.5)
Eosinophils Relative: 1 %
HCT: 46.7 % — ABNORMAL HIGH (ref 36.0–46.0)
Hemoglobin: 15.5 g/dL — ABNORMAL HIGH (ref 12.0–15.0)
Immature Granulocytes: 1 %
Lymphocytes Relative: 17 %
Lymphs Abs: 3.9 10*3/uL (ref 0.7–4.0)
MCH: 31.4 pg (ref 26.0–34.0)
MCHC: 33.2 g/dL (ref 30.0–36.0)
MCV: 94.5 fL (ref 80.0–100.0)
Monocytes Absolute: 3.6 10*3/uL — ABNORMAL HIGH (ref 0.1–1.0)
Monocytes Relative: 15 %
Neutro Abs: 14.9 10*3/uL — ABNORMAL HIGH (ref 1.7–7.7)
Neutrophils Relative %: 66 %
Platelets: 411 10*3/uL — ABNORMAL HIGH (ref 150–400)
RBC: 4.94 MIL/uL (ref 3.87–5.11)
RDW: 13.4 % (ref 11.5–15.5)
WBC: 23 10*3/uL — ABNORMAL HIGH (ref 4.0–10.5)
nRBC: 0 % (ref 0.0–0.2)

## 2020-04-17 LAB — GLUCOSE, CAPILLARY
Glucose-Capillary: 103 mg/dL — ABNORMAL HIGH (ref 70–99)
Glucose-Capillary: 118 mg/dL — ABNORMAL HIGH (ref 70–99)
Glucose-Capillary: 195 mg/dL — ABNORMAL HIGH (ref 70–99)
Glucose-Capillary: 205 mg/dL — ABNORMAL HIGH (ref 70–99)
Glucose-Capillary: 226 mg/dL — ABNORMAL HIGH (ref 70–99)
Glucose-Capillary: 90 mg/dL (ref 70–99)

## 2020-04-17 LAB — PHOSPHORUS: Phosphorus: 3.2 mg/dL (ref 2.5–4.6)

## 2020-04-17 LAB — CSF CULTURE W GRAM STAIN
Culture: NO GROWTH
Special Requests: NORMAL

## 2020-04-17 LAB — BASIC METABOLIC PANEL
Anion gap: 9 (ref 5–15)
BUN: 46 mg/dL — ABNORMAL HIGH (ref 6–20)
CO2: 22 mmol/L (ref 22–32)
Calcium: 8.1 mg/dL — ABNORMAL LOW (ref 8.9–10.3)
Chloride: 110 mmol/L (ref 98–111)
Creatinine, Ser: 0.91 mg/dL (ref 0.44–1.00)
GFR calc Af Amer: >60 mL/min (ref >60)
GFR calc non Af Amer: >60 mL/min (ref >60)
Glucose, Bld: 151 mg/dL — ABNORMAL HIGH (ref 70–99)
Potassium: 3.4 mmol/L — ABNORMAL LOW (ref 3.5–5.1)
Sodium: 141 mmol/L (ref 135–145)

## 2020-04-17 LAB — PATHOLOGIST SMEAR REVIEW

## 2020-04-17 LAB — LEGIONELLA PNEUMOPHILA SEROGP 1 UR AG: L. pneumophila Serogp 1 Ur Ag: NEGATIVE

## 2020-04-17 LAB — MAGNESIUM: Magnesium: 2.2 mg/dL (ref 1.7–2.4)

## 2020-04-17 MED ORDER — FAMOTIDINE 20 MG PO TABS: 20.0000 mg | ORAL_TABLET | Freq: Two times a day (BID) | ORAL | Status: DC

## 2020-04-17 MED ORDER — ORAL CARE MOUTH RINSE
15.0000 mL | Freq: Two times a day (BID) | OROMUCOSAL | Status: DC
  Administered 2020-04-17 – 2020-04-19 (×3): 15 mL via OROMUCOSAL

## 2020-04-17 MED ORDER — CHLORHEXIDINE GLUCONATE 0.12 % MT SOLN
15.0000 mL | Freq: Two times a day (BID) | OROMUCOSAL | Status: DC
  Administered 2020-04-17 – 2020-04-21 (×8): 15 mL via OROMUCOSAL
  Filled 2020-04-17 (×7): qty 15

## 2020-04-17 MED ORDER — POTASSIUM CHLORIDE 20 MEQ PO PACK
40.0000 meq | PACK | Freq: Once | ORAL | Status: AC
  Administered 2020-04-17: 40 meq
  Filled 2020-04-17: qty 2

## 2020-04-17 MED ORDER — ENOXAPARIN SODIUM 40 MG/0.4ML ~~LOC~~ SOLN
40.0000 mg | Freq: Two times a day (BID) | SUBCUTANEOUS | Status: DC
  Administered 2020-04-17 – 2020-04-21 (×8): 40 mg via SUBCUTANEOUS
  Filled 2020-04-17 (×8): qty 0.4

## 2020-04-17 MED ORDER — VANCOMYCIN HCL IN DEXTROSE 1-5 GM/200ML-% IV SOLN
1000.0000 mg | Freq: Two times a day (BID) | INTRAVENOUS | Status: AC
  Administered 2020-04-17 – 2020-04-18 (×3): 1000 mg via INTRAVENOUS
  Filled 2020-04-17 (×4): qty 200

## 2020-04-17 MED ORDER — ADULT MULTIVITAMIN LIQUID CH
15.0000 mL | Freq: Every day | ORAL | Status: DC
  Filled 2020-04-17 (×4): qty 15

## 2020-04-17 MED ORDER — LORAZEPAM 2 MG/ML IJ SOLN
1.0000 mg | Freq: Once | INTRAMUSCULAR | Status: AC
  Administered 2020-04-17: 1 mg via INTRAVENOUS

## 2020-04-17 NOTE — Progress Notes (Signed)
CRITICAL CARE NOTE 49 y.o.femalewith a history of diabetes who is brought to the ED due to altered mental status.  She was found on the floor at home covered in feces urine and vomit by her boyfriend.  EMS report the patient is confused, agitated, unable to provide history or engage in medical care. Blood sugar on scene read as "high." Patient with acute resp failure Acute toxic metabolic encephalopathy Intubated for severe resp failure Patient arrived to ICU with severe resp failure and postural decortication  SIGNIFICANT EVENTS 5/13 intubated, sedated, DKA admitted for resp failure 5/14 CVL placed, on pressors, weaned off Insulin drip 5/15 failed weaning trials due to severe encephalopathy, started on precedex 5/16 Meningitis/encephalitis 5/17 - extubated from ventilator on nasal canula supplemental O2  CC  follow up respiratory failure  SUBJECTIVE Patient remains critically ill Prognosis is guarded multiorgan failure    BP (!) 133/55   Pulse 85   Temp 99.1 F (37.3 C)   Resp 10   Ht 5' 7.01" (1.702 m)   Wt 132.4 kg   SpO2 98%   BMI 45.71 kg/m    I/O last 3 completed shifts: In: 4841.2 [I.V.:2466.3; IV Piggyback:2374.9] Out: 1600 [Urine:1600] Total I/O In: 344.2 [I.V.:247.3; IV Piggyback:96.9] Out: 115 [Urine:115]  SpO2: 98 % O2 Flow Rate (L/min): 2 L/min FiO2 (%): 24 %  Estimated body mass index is 45.71 kg/m as calculated from the following:   Height as of this encounter: 5' 7.01" (1.702 m).   Weight as of this encounter: 132.4 kg.  SIGNIFICANT EVENTS   REVIEW OF SYSTEMS  PATIENT IS UNABLE TO PROVIDE COMPLETE REVIEW OF SYSTEMS DUE TO SEVERE CRITICAL ILLNESS        PHYSICAL EXAMINATION:  GENERAL:critically ill appearing, +resp distress HEAD: Normocephalic, atraumatic.  EYES: Pupils equal, round, reactive to light.  No scleral icterus.  MOUTH: Moist mucosal membrane. NECK: Supple.  PULMONARY: +rhonchi, +wheezing CARDIOVASCULAR: S1 and  S2. Regular rate and rhythm. No murmurs, rubs, or gallops.  GASTROINTESTINAL: Soft, nontender, -distended.  Positive bowel sounds.   MUSCULOSKELETAL: No swelling, clubbing, or edema.  NEUROLOGIC: obtunded, GCS<8 SKIN:intact,warm,dry  MEDICATIONS: I have reviewed all medications and confirmed regimen as documented   CULTURE RESULTS   Recent Results (from the past 240 hour(s))  MRSA PCR Screening     Status: None   Collection Time: 04/13/20  9:14 AM   Specimen: Nasopharyngeal  Result Value Ref Range Status   MRSA by PCR NEGATIVE NEGATIVE Final    Comment:        The GeneXpert MRSA Assay (FDA approved for NASAL specimens only), is one component of a comprehensive MRSA colonization surveillance program. It is not intended to diagnose MRSA infection nor to guide or monitor treatment for MRSA infections. Performed at [facility], [address] [place], [place] [address]   Culture, blood (routine x 2)     Status: None (Preliminary result)   Collection Time: 04/13/20  9:17 AM   Specimen: BLOOD  Result Value Ref Range Status   Specimen Description BLOOD BLOOD RIGHT HAND  Final   Special Requests   Final    BOTTLES DRAWN AEROBIC AND ANAEROBIC Blood Culture adequate volume   Culture   Final    NO GROWTH 4 DAYS Performed at [facility], [address], [place], [place] [address]    Report Status PENDING  Incomplete  Culture, blood (routine x 2)     Status: None (Preliminary result)   Collection Time: 04/13/20  9:17  AM   Specimen: BLOOD  Result Value Ref Range Status   Specimen Description BLOOD BLOOD LEFT HAND  Final   Special Requests   Final    BOTTLES DRAWN AEROBIC AND ANAEROBIC Blood Culture adequate volume   Culture   Final    NO GROWTH 4 DAYS Performed at [facility], [address], [place], [place] [address]    Report Status PENDING  Incomplete  SARS Coronavirus 2 by RT PCR (hospital order, performed in [facility] hospital lab)  Nasopharyngeal Nasopharyngeal Swab     Status: None   Collection Time: 04/13/20  9:17 AM   Specimen: Nasopharyngeal Swab  Result Value Ref Range Status   SARS Coronavirus 2 NEGATIVE NEGATIVE Final    Comment: (NOTE) SARS-CoV-2 target nucleic acids are NOT DETECTED. The SARS-CoV-2 RNA is generally detectable in upper and lower respiratory specimens during the acute phase of infection. The lowest concentration of SARS-CoV-2 viral copies this assay can detect is 250 copies / mL. A negative result does not preclude SARS-CoV-2 infection and should not be used as the sole basis for treatment or other patient management decisions.  A negative result may occur with improper specimen collection / handling, submission of specimen other than nasopharyngeal swab, presence of viral mutation(s) within the areas targeted by this assay, and inadequate number of viral copies (<250 copies / mL). A negative result must be combined with clinical observations, patient history, and epidemiological information. Fact Sheet for Patients:   StrictlyIdeas.no Fact Sheet for Healthcare Providers: BankingDealers.co.za This test is not yet approved or cleared  by the [place] FDA and has been authorized for detection and/or diagnosis of SARS-CoV-2 by FDA under an Emergency Use Authorization (EUA).  This EUA will remain in effect (meaning this test can be used) for the duration of the COVID-19 declaration under Section 564(b)(1) of the Act, 21 U.S.C. section 360bbb-3(b)(1), unless the authorization is terminated or revoked sooner. Performed at [facility], [address], [place], [place] [address]   Urine culture     Status: None   Collection Time: 04/13/20 10:27 AM   Specimen: Urine, Random  Result Value Ref Range Status   Specimen Description   Final    URINE, RANDOM Performed at [facility], [address] [place], [place]  [address]    Special Requests   Final    NONE Performed at [facility], [address] [place], [place] [address]    Culture   Final    NO GROWTH Performed at [facility], [place] [address] [place], [place] [address]    Report Status 04/14/2020 FINAL  Final  CSF culture     Status: None   Collection Time: 04/14/20  2:00 PM   Specimen: CSF; Cerebrospinal Fluid  Result Value Ref Range Status   Specimen Description   Final    CSF Performed at [facility], [address] [place], [place] [address]    Special Requests   Final    Normal Performed at [facility], [place], [place] [address]    Gram Stain WBC SEEN RED BLOOD CELLS NO ORGANISMS SEEN   Final   Culture   Final    NO GROWTH Performed at [facility], [place] [address], [place], [place] [address]    Report Status 04/17/2020 FINAL  Final  Anaerobic culture     Status: None (Preliminary result)   Collection Time: 04/14/20  4:38 PM  Specimen: PATH Cytology CSF; Cerebrospinal Fluid  Result Value Ref Range Status   Specimen Description   Final    CSF Performed at [facility], [address], [place], [place] [address]    Special Requests   Final    NONE Performed at [facility], [address] [place], [place] [address]    Culture   Final    NO ANAEROBES ISOLATED; CULTURE IN PROGRESS FOR 5 DAYS   Report Status PENDING  Incomplete  Gram stain     Status: None   Collection Time: 04/14/20  4:38 PM   Specimen: PATH Cytology CSF; Cerebrospinal Fluid  Result Value Ref Range Status   Specimen Description CSF  Final   Special Requests NONE  Final   Gram Stain   Final    NO ORGANISMS SEEN WBC SEEN RED BLOOD CELLS SEEN Performed at [facility], [address], [place], [place] [address]    Report Status 04/15/2020 FINAL  Final  Culture, fungus without smear     Status: None (Preliminary result)   Collection Time: 04/14/20  4:38 PM    Specimen: PATH Cytology CSF; Cerebrospinal Fluid  Result Value Ref Range Status   Specimen Description   Final    CSF Performed at [facility], [address] [place], [place] [address]    Special Requests   Final    NONE Performed at [facility], [address] [place], [place] [address]    Culture   Final    NO GROWTH 3 DAYS Performed at [facility], [place] [address], [place], [place] [address]    Report Status PENDING  Incomplete        CBC    Component Value Date/Time   WBC 23.0 (H) 04/17/2020 0602   RBC 4.94 04/17/2020 0602   HGB 15.5 (H) 04/17/2020 0602   HCT 46.7 (H) 04/17/2020 0602   PLT 411 (H) 04/17/2020 0602   MCV 94.5 04/17/2020 0602   MCH 31.4 04/17/2020 0602   MCHC 33.2 04/17/2020 0602   RDW 13.4 04/17/2020 0602   LYMPHSABS 3.9 04/17/2020 0602   MONOABS 3.6 (H) 04/17/2020 0602   EOSABS 0.3 04/17/2020 0602   BASOSABS 0.1 04/17/2020 0602   BMP Latest Ref Rng & Units 04/17/2020 04/16/2020 04/16/2020  Glucose 70 - 99 mg/dL 151(H) 170(H) 195(H)  BUN 6 - 20 mg/dL 46(H) 43(H) 43(H)  Creatinine 0.44 - 1.00 mg/dL 0.91 0.90 1.10(H)  Sodium 135 - 145 mmol/L 141 140 140  Potassium 3.5 - 5.1 mmol/L 3.4(L) 3.6 3.9  Chloride 98 - 111 mmol/L 110 112(H) 110  CO2 22 - 32 mmol/L 22 21(L) 24  Calcium 8.9 - 10.3 mg/dL 8.1(L) 7.5(L) 8.0(L)      IMAGING    No results found.   Nutrition Status: Nutrition Problem: Inadequate oral intake Etiology: inability to eat Signs/Symptoms: NPO status Interventions: Tube feeding, Prostat, MVI     Indwelling Urinary Catheter continued, requirement due to   Reason to continue Indwelling Urinary Catheter strict Intake/Output monitoring for hemodynamic instability   Central Line/ continued, requirement due to  Reason to continue [place] of central venous pressure or other hemodynamic parameters and poor IV access   Ventilator continued, requirement due to severe respiratory failure    Ventilator Sedation RASS 0 to -2      ASSESSMENT AND PLAN SYNOPSIS  Severe ACUTE Hypoxic and Hypercapnic Respiratory Failuredue severe acidosis due to DKA with toxic metabolic encephalopathycomplicated by fevers  etiology probable c/w CAP SEPSIS due to MENINGITIS/ENCEPHALITIS s/p CSF fluid eval - atypical profile with lymphocytic predominant leukocytosis  Severe ACUTE Hypoxic and Hypercapnic Respiratory Failure -continue Full MV support -continue Bronchodilator Therapy -Wean Fio2 and PEEP as tolerated -will perform SAT/SBT when respiratory parameters are met -VAP/VENT bundle implementation -extubated 04/17/20  Morbid obesity, possible OSA.   Will certainly impact respiratory mechanics, ventilator weaning Suspect will need to consider additional PEEP, possible extubation to BiPAP when appropriate to consider   ACUTE KIDNEY INJURY/Renal Failure -continue Foley Catheter-assess need -Avoid nephrotoxic agents -Follow urine output, BMP -Ensure adequate renal perfusion, optimize oxygenation -Renal dose medications     NEUROLOGY -extubated 5/17 Acute toxic metabolic encephalopathy, need for sedation Goal RASS -2 to -3   CARDIAC ICU monitoring  ID DX of CAP and MENINGITIS AND ENCEPHALITIS -continue IV abx as prescibed -follow up cultures  GI GI PROPHYLAXIS as indicated  NUTRITIONAL STATUS Nutrition Status: Nutrition Problem: Inadequate oral intake Etiology: inability to eat Signs/Symptoms: NPO status Interventions: Tube feeding, Prostat, MVI   DIET-->TF's as tolerated Constipation protocol as indicated  ENDO - will use ICU hypoglycemic\Hyperglycemia protocol if indicated     ELECTROLYTES -follow labs as needed -replace as needed -pharmacy consultation and following   DVT/GI PRX ordered and assessed TRANSFUSIONS AS NEEDED MONITOR FSBS I Assessed the need for Labs I Assessed the need for Foley I Assessed the need for Central Venous Line Family  Discussion when available I Assessed the need for Mobilization I made an Assessment of medications to be adjusted accordingly Safety Risk assessment completed   CASE DISCUSSED IN MULTIDISCIPLINARY ROUNDS WITH ICU TEAM  Critical Care Time devoted to patient care services described in this note is 33 minutes.   Overall, patient is critically ill, prognosis is guarded.  Patient with Multiorgan failure and at high risk for cardiac arrest and death.     Ottie Glazier, M.D.  [facility] [place]

## 2020-04-17 NOTE — Progress Notes (Signed)
PHARMACY CONSULT NOTE  Pharmacy Consult for Electrolyte Monitoring and Replacement   Recent Labs: Potassium (mmol/L)  Date Value  04/17/2020 3.4 (L)   Magnesium (mg/dL)  Date Value  46/03/7997 2.2   Calcium (mg/dL)  Date Value  72/15/8727 8.1 (L)   Albumin (g/dL)  Date Value  61/84/8592 3.4 (L)   Phosphorus (mg/dL)  Date Value  76/39/4320 3.2   Sodium (mmol/L)  Date Value  04/17/2020 141   Corrected Ca: 8.58 mg/dL  Assessment: 49 year old female admitted with DKA, started on insulin drip. DKA has resolved, patient transitioned off insulin drip. Patient also with respiratory failure requiring intubation.  Pharmacy to manage electrolytes.  Goal of Therapy:  Electrolytes WNL  Plan:   Potassium 3.4: give Klor-Con packet 40 mEq per tube x1 dose. All other electrolytes WNL  Will continue to monitor and follow up with morning labs  Delfin Gant ,PharmD Clinical Pharmacist 04/17/2020 1:19 PM

## 2020-04-17 NOTE — Progress Notes (Signed)
Subjective: Patient remains intubated. On Fentanyl down now to 88mg/hr and precedex 1.2. Not clearly following commands. On Levophed for hypotension   Objective: Current vital signs: BP (!) 147/59   Pulse (!) 115   Temp 98.8 F (37.1 C)   Resp 18   Ht 5' 7.01" (1.702 m)   Wt 132.4 kg   SpO2 97%   BMI 45.71 kg/m  Vital signs in last 24 hours: Temp:  [94.1 F (34.5 C)-98.8 F (37.1 C)] 98.8 F (37.1 C) (05/17 1030) Pulse Rate:  [50-118] 115 (05/17 1030) Resp:  [0-18] 18 (05/17 1030) BP: (63-147)/(19-69) 147/59 (05/17 1030) SpO2:  [93 %-100 %] 97 % (05/17 1030) FiO2 (%):  [24 %-28 %] 24 % (05/17 0748) Weight:  [132.4 kg] 132.4 kg (05/17 0500)  Intake/Output from previous day: 05/16 0701 - 05/17 0700 In: 3457.6 [I.V.:1676.9; IV Piggyback:1780.7] Out: 1325 [Urine:1325] Intake/Output this shift: Total I/O In: 344.2 [I.V.:247.3; IV Piggyback:96.9] Out: 115 [Urine:115] Nutritional status:  Diet Order            Diet NPO time specified  Diet effective now              Neurologic Exam: Mental Status: Patient does not respond to verbal stimuli.  Does not respond to deep sternal rub.  Does not follow commands.  No verbalizations noted.  Cranial Nerves: II: patient does not respond confrontation bilaterally, pupils right 2 mm, left 2 mm,and unreactive bilaterally III,IV,VI: Oculocephalic response absent bilaterally.  V,VII: corneal reflex absent bilaterally  VIII: patient does not respond to verbal stimuli IX,X: gag reflex reduced, XI: trapezius strength unable to test bilaterally XII: tongue strength unable to test Motor: Extremities flaccid throughout.  Some decerebrate posturing noted at times Sensory: Does not respond to noxious stimuli in any extremity. Deep Tendon Reflexes:  Absent throughout. Plantars: Mute bilaterally Cerebellar: Unable to perform  Lab Results: Basic Metabolic Panel: Recent Labs  Lab 04/15/20 0442 04/15/20 0442 04/15/20 1938  04/15/20 1938 04/16/20 0458 04/16/20 1738 04/17/20 0602  NA 140  --  139  --  140 140 141  K 3.8  --  4.2  --  3.9 3.6 3.4*  CL 109  --  109  --  110 112* 110  CO2 24  --  23  --  24 21* 22  GLUCOSE 259*  --  330*  --  195* 170* 151*  BUN 30*  --  40*  --  43* 43* 46*  CREATININE 0.94  --  1.02*  --  1.10* 0.90 0.91  CALCIUM 7.6*   < > 8.0*   < > 8.0* 7.5* 8.1*  MG 1.7  --   --   --  2.4 2.3 2.2  PHOS 2.7  --   --   --  3.4 3.2 3.2   < > = values in this interval not displayed.    Liver Function Tests: Recent Labs  Lab 04/13/20 0917  AST 19  ALT 17  ALKPHOS 72  BILITOT 2.1*  PROT 7.1  ALBUMIN 3.4*   Recent Labs  Lab 04/13/20 0917  LIPASE 24   No results for input(s): AMMONIA in the last 168 hours.  CBC: Recent Labs  Lab 04/13/20 0917 04/14/20 0336 04/15/20 0442 04/16/20 0458 04/17/20 0602  WBC 20.7* 17.5* 10.4 10.0 23.0*  NEUTROABS 18.2*  --  8.7* 8.0* 14.9*  HGB 12.9 10.3* 11.0* 13.1 15.5*  HCT 38.6 30.6* 33.5* 40.2 46.7*  MCV 93.2 93.0 93.8 93.3 94.5  PLT  358 263 253 291 411*    Cardiac Enzymes: No results for input(s): CKTOTAL, CKMB, CKMBINDEX, TROPONINI in the last 168 hours.  Lipid Panel: Recent Labs  Lab 04/14/20 1101  TRIG 118    CBG: Recent Labs  Lab 04/16/20 1618 04/16/20 1930 04/17/20 0012 04/17/20 0402 04/17/20 0719  GLUCAP 161* 173* 118* 68 103*    Microbiology: Results for orders placed or performed during the hospital encounter of 04/13/20  MRSA PCR Screening     Status: None   Collection Time: 04/13/20  9:14 AM   Specimen: Nasopharyngeal  Result Value Ref Range Status   MRSA by PCR NEGATIVE NEGATIVE Final    Comment:        The GeneXpert MRSA Assay (FDA approved for NASAL specimens only), is one component of a comprehensive MRSA colonization surveillance program. It is not intended to diagnose MRSA infection nor to guide or monitor treatment for MRSA infections. Performed at [facility], [address] [place], [place] [address]   Culture, blood (routine x 2)     Status: None (Preliminary result)   Collection Time: 04/13/20  9:17 AM   Specimen: BLOOD  Result Value Ref Range Status   Specimen Description BLOOD BLOOD RIGHT HAND  Final   Special Requests   Final    BOTTLES DRAWN AEROBIC AND ANAEROBIC Blood Culture adequate volume   Culture   Final    NO GROWTH 4 DAYS Performed at [facility], [address], [place], [place] [address]    Report Status PENDING  Incomplete  Culture, blood (routine x 2)     Status: None (Preliminary result)   Collection Time: 04/13/20  9:17 AM   Specimen: BLOOD  Result Value Ref Range Status   Specimen Description BLOOD BLOOD LEFT HAND  Final   Special Requests   Final    BOTTLES DRAWN AEROBIC AND ANAEROBIC Blood Culture adequate volume   Culture   Final    NO GROWTH 4 DAYS Performed at [facility], [address], [place], [place] [address]    Report Status PENDING  Incomplete  SARS Coronavirus 2 by RT PCR (hospital order, performed in [place] hospital lab) Nasopharyngeal Nasopharyngeal Swab     Status: None   Collection Time: 04/13/20  9:17 AM   Specimen: Nasopharyngeal Swab  Result Value Ref Range Status   SARS Coronavirus 2 NEGATIVE NEGATIVE Final    Comment: (NOTE) SARS-CoV-2 target nucleic acids are NOT DETECTED. The SARS-CoV-2 RNA is generally detectable in upper and lower respiratory specimens during the acute phase of infection. The lowest concentration of SARS-CoV-2 viral copies this assay can detect is 250 copies / mL. A negative result does not preclude SARS-CoV-2 infection and should not be used as the sole basis for treatment or other patient management decisions.  A negative result may occur with improper specimen collection / handling, submission of specimen other than nasopharyngeal swab, presence of viral mutation(s) within the areas targeted by this assay, and inadequate number of viral  copies (<250 copies / mL). A negative result must be combined with clinical observations, patient history, and epidemiological information. Fact Sheet for Patients:   StrictlyIdeas.no Fact Sheet for Healthcare Providers: BankingDealers.co.za This test is not yet approved or cleared  by the [place] FDA and has been authorized for detection and/or diagnosis of SARS-CoV-2 by FDA under an Emergency Use Authorization (EUA).  This EUA will remain in effect (meaning this test can be used) for the duration of  the COVID-19 declaration under Section 564(b)(1) of the Act, 21 U.S.C. section 360bbb-3(b)(1), unless the authorization is terminated or revoked sooner. Performed at [facility], [address], [place], [place] [address]   Urine culture     Status: None   Collection Time: 04/13/20 10:27 AM   Specimen: Urine, Random  Result Value Ref Range Status   Specimen Description   Final    URINE, RANDOM Performed at [facility], [address] [place], [place] [address]    Special Requests   Final    NONE Performed at [facility], [address] [place], [place] [address]    Culture   Final    NO GROWTH Performed at [facility], [place] [address], [place], [place] [address]    Report Status 04/14/2020 FINAL  Final  CSF culture     Status: None   Collection Time: 04/14/20  2:00 PM   Specimen: CSF; Cerebrospinal Fluid  Result Value Ref Range Status   Specimen Description   Final    CSF Performed at [facility], [address], [place], [place] [address]    Special Requests   Final    Normal Performed at [facility], [place], [place] [address]    Gram Stain WBC SEEN RED BLOOD CELLS NO ORGANISMS SEEN   Final   Culture   Final    NO GROWTH Performed at [facility], [place] [address] [place], [place] [address]    Report Status 04/17/2020 FINAL  Final   Anaerobic culture     Status: None (Preliminary result)   Collection Time: 04/14/20  4:38 PM   Specimen: PATH Cytology CSF; Cerebrospinal Fluid  Result Value Ref Range Status   Specimen Description   Final    CSF Performed at [facility], [address], [place], [place] [address]    Special Requests   Final    NONE Performed at [facility], [address] [place], [place] [address]    Culture   Final    NO ANAEROBES ISOLATED; CULTURE IN PROGRESS FOR 5 DAYS   Report Status PENDING  Incomplete  Gram stain     Status: None   Collection Time: 04/14/20  4:38 PM   Specimen: PATH Cytology CSF; Cerebrospinal Fluid  Result Value Ref Range Status   Specimen Description CSF  Final   Special Requests NONE  Final   Gram Stain   Final    NO ORGANISMS SEEN WBC SEEN RED BLOOD CELLS SEEN Performed at [facility], [address] [place], [place] [address]    Report Status 04/15/2020 FINAL  Final  Culture, fungus without smear     Status: None (Preliminary result)   Collection Time: 04/14/20  4:38 PM   Specimen: PATH Cytology CSF; Cerebrospinal Fluid  Result Value Ref Range Status   Specimen Description   Final    CSF Performed at [facility], [address] [place], [place] [address]    Special Requests   Final    NONE Performed at [facility], [address] [place], [place] [address]    Culture   Final    NO FUNGUS ISOLATED AFTER 2 DAYS Performed at [facility], [place] [address] [place], [place] [address]    Report Status PENDING  Incomplete    Coagulation Studies: No results for input(s): LABPROT, INR in the last 72 hours.  Imaging: No results found.  Medications:  I have reviewed the patient's current medications. Scheduled: . amLODipine  5 mg Per Tube Daily  . chlorhexidine gluconate (MEDLINE KIT)  15 mL Mouth Rinse BID  . Chlorhexidine Gluconate Cloth  6 each Topical Daily  . docusate  100 mg Per Tube  BID  . enoxaparin (LOVENOX) injection  40 mg Subcutaneous Q12H  . famotidine  20 mg Per Tube BID  . feeding supplement (PRO-STAT SUGAR FREE 64)  60 mL Per Tube TID  . feeding supplement (VITAL HIGH PROTEIN)  1,000 mL Per Tube Q24H  . insulin aspart  0-20 Units Subcutaneous Q4H  . insulin detemir  0.3 Units/kg Subcutaneous QHS  . LORazepam  4 mg Intravenous Once  . mouth rinse  15 mL Mouth Rinse 10 times per day  . multivitamin  15 mL Per Tube Daily  . polyethylene glycol  17 g Per Tube Daily  . sodium chloride flush  3 mL Intravenous Q12H    Assessment/Plan: 49 y.o. female with a history of diabetes and was brought to the ED due to altered mental status. Initial blood sugar here of 589, found to be in DKA, ?sepsis.  Due to inability to protect her airway the patient was intubated and sedated.  Noted to have posturing, described as decerebrate.  Presentation very likely metabolic in etiology.  Will rule out other possible etiologies.  Head CT personally reviewed and shows no acute changes.  EEG shows burst suppression activity but that is when on significant sedation    - Yesterday AM would follow intermittent commands.  - No abnormalities on CTH - s/p LP concerning for elevated WBC of 49 followed by 10 in another tube - titration sedation trials for possible extubation and seeing if she has cuff leak - attempt for extubation made yesterday but patient became agitated, tachycardic   04/17/2020  10:44 AM

## 2020-04-17 NOTE — Progress Notes (Signed)
Ch visited pt. while rounding on ICU; pt. in bed, intubated and restless --> clawing drowsily at breathing tube and visibly uncomfortable.  Son Lyons @ bedside in recliner; son shared pt. was found covered in vomit and unresponsive --> brought to [facility].  Pt. is Type -I diabetic and son thinks pt.'s incident may be related to her sugar levels but he is not sure; son said team is concerned Pt. may have developed meningitis --is being treated for this.  Intensivist and RT team arrived shortly after to extubate pt. CH talked w/son in hallway; learned that pt.'s father died in housefire in 12/23/19; son said this loss has been very difficult for pt.  Pt. lives w/her three sons; pt.'s boyfriend has been helping support sons during this time --appears to be major support person to pt. as well.  Son reentered rm. following extubation; no needs expressed at this time.  CH remains available as needed.

## 2020-04-17 NOTE — Progress Notes (Signed)
PHARMACIST - PHYSICIAN COMMUNICATION  CONCERNING: IV to Oral Route Change Policy  RECOMMENDATION: This patient is receiving famotidine by the intravenous route.  Based on criteria approved by the Pharmacy and Therapeutics Committee, the intravenous medication(s) is/are being converted to the equivalent oral dose form(s).   DESCRIPTION: These criteria include:  The patient is eating (either orally or via tube) and/or has been taking other orally administered medications for a least 24 hours  The patient has no evidence of active gastrointestinal bleeding or impaired GI absorption (gastrectomy, short bowel, patient on TNA or NPO).  If you have questions about this conversion, please contact the Pharmacy Department at [phone].    Kezia Benevides L, [facility] 04/17/2020 9:48 AM

## 2020-04-17 NOTE — Progress Notes (Signed)
Assisted tele visit to patient with sister, Lelon Mast.  Vena Austria, RN

## 2020-04-17 NOTE — Progress Notes (Signed)
Pt extubated, tolerating room air at this time. OG tube removed with ETT. Follows commands. Oriented to self only. Very hoarse voice. Movement is symmetrical in all extremities. Denies pain. Family at bedside. VSS. Will continue to monitor.

## 2020-04-17 NOTE — Progress Notes (Signed)
Patient extubated per Dr. Magdalene Molly. Patient extubated to nasal cannula 2L, O2 sat 100%. No distress noted at this time.

## 2020-04-17 NOTE — Progress Notes (Signed)
Pharmacy Antibiotic Note  Jessica Santiago is a 49 y.o. female admitted on 04/13/2020 with concern for meningitis. S/P LP concerning for elevated WBC of 49 followed by 10 in another tube. She remains mechanically ventilated. Pharmacy was consulted for vancomycin and acyclovir dosing. This is day  #5 of broad-spectrum IV antibiotics, confirmed continuing with neurology.   Plan: 1) continue acyclovir 825 mg IV q8hrs (10 mg/kg adjusted body weight)  2) Continue vancomycin 1g IV Q12hr. Goal trough for meningitis 15-20. WBC/SCR with am labs.   Height: 5' 7.01" (170.2 cm) Weight: 132.4 kg (291 lb 14.2 oz) IBW/kg (Calculated) : 61.62  Temp (24hrs), Avg:98.2 F (36.8 C), Min:96.4 F (35.8 C), Max:99.5 F (37.5 C)  Recent Labs  Lab 04/13/20 0917 04/13/20 1042 04/13/20 1445 04/14/20 0336 04/14/20 0336 04/15/20 0442 04/15/20 1938 04/16/20 0458 04/16/20 1738 04/17/20 0602  WBC 20.7*  --   --  17.5*  --  10.4  --  10.0  --  23.0*  CREATININE 1.43*  --    < > 1.20*   < > 0.94 1.02* 1.10* 0.90 0.91  LATICACIDVEN 3.2* 2.4*  --   --   --   --   --   --   --   --    < > = values in this interval not displayed.    Estimated Creatinine Clearance: 107.3 mL/min (by C-G formula based on SCr of 0.91 mg/dL).    Not on File  Antimicrobials this admission: Vancomycin 5/13 >> Ceftriaxone 5/13 >> Acyclovir 5/13 >>  Dose adjustments this admission: 5/14 Vancomycin 1250 mg q12h >> 1000 mg q12h 5/16 vancomycin 1000 mg q12h >> 1750 mg q 24h 5/17 vancomycin 1750mg  Q24hr >> 1000mg  Q12hr.   Microbiology results: 5/13 BCx: NGTD 5/13 UCx: no growth 5/13 MRSA PCR: negative 5/14 LP: WBC 49  Thank you for allowing pharmacy to be a part of this patient's care.  Oluwateniola Leitch L, PharmD 04/17/2020 8:28 PM

## 2020-04-18 ENCOUNTER — Inpatient Hospital Stay: Payer: Medicaid Other

## 2020-04-18 LAB — ALBUMIN: Albumin: 1.8 g/dL — ABNORMAL LOW (ref 3.5–5.0)

## 2020-04-18 LAB — CBC WITH DIFFERENTIAL/PLATELET
Abs Immature Granulocytes: 0.1 10*3/uL — ABNORMAL HIGH (ref 0.00–0.07)
Basophils Absolute: 0 10*3/uL (ref 0.0–0.1)
Basophils Relative: 0 %
Eosinophils Absolute: 0.5 10*3/uL (ref 0.0–0.5)
Eosinophils Relative: 4 %
HCT: 35.1 % — ABNORMAL LOW (ref 36.0–46.0)
Hemoglobin: 11.4 g/dL — ABNORMAL LOW (ref 12.0–15.0)
Immature Granulocytes: 1 %
Lymphocytes Relative: 21 %
Lymphs Abs: 3 10*3/uL (ref 0.7–4.0)
MCH: 31.2 pg (ref 26.0–34.0)
MCHC: 32.5 g/dL (ref 30.0–36.0)
MCV: 96.2 fL (ref 80.0–100.0)
Monocytes Absolute: 2.1 10*3/uL — ABNORMAL HIGH (ref 0.1–1.0)
Monocytes Relative: 15 %
Neutro Abs: 8.3 10*3/uL — ABNORMAL HIGH (ref 1.7–7.7)
Neutrophils Relative %: 59 %
Platelets: 223 10*3/uL (ref 150–400)
RBC: 3.65 MIL/uL — ABNORMAL LOW (ref 3.87–5.11)
RDW: 13.2 % (ref 11.5–15.5)
WBC: 14 10*3/uL — ABNORMAL HIGH (ref 4.0–10.5)
nRBC: 0 % (ref 0.0–0.2)

## 2020-04-18 LAB — BASIC METABOLIC PANEL
Anion gap: 7 (ref 5–15)
BUN: 45 mg/dL — ABNORMAL HIGH (ref 6–20)
CO2: 23 mmol/L (ref 22–32)
Calcium: 7.7 mg/dL — ABNORMAL LOW (ref 8.9–10.3)
Chloride: 112 mmol/L — ABNORMAL HIGH (ref 98–111)
Creatinine, Ser: 1.01 mg/dL — ABNORMAL HIGH (ref 0.44–1.00)
GFR calc Af Amer: >60 mL/min (ref >60)
GFR calc non Af Amer: >60 mL/min (ref >60)
Glucose, Bld: 165 mg/dL — ABNORMAL HIGH (ref 70–99)
Potassium: 3.5 mmol/L (ref 3.5–5.1)
Sodium: 142 mmol/L (ref 135–145)

## 2020-04-18 LAB — GLUCOSE, CAPILLARY
Glucose-Capillary: 125 mg/dL — ABNORMAL HIGH (ref 70–99)
Glucose-Capillary: 144 mg/dL — ABNORMAL HIGH (ref 70–99)
Glucose-Capillary: 160 mg/dL — ABNORMAL HIGH (ref 70–99)
Glucose-Capillary: 160 mg/dL — ABNORMAL HIGH (ref 70–99)
Glucose-Capillary: 211 mg/dL — ABNORMAL HIGH (ref 70–99)
Glucose-Capillary: 249 mg/dL — ABNORMAL HIGH (ref 70–99)
Glucose-Capillary: 91 mg/dL (ref 70–99)

## 2020-04-18 LAB — CULTURE, BLOOD (ROUTINE X 2)
Culture: NO GROWTH
Culture: NO GROWTH
Special Requests: ADEQUATE
Special Requests: ADEQUATE

## 2020-04-18 LAB — PHOSPHORUS: Phosphorus: 2.6 mg/dL (ref 2.5–4.6)

## 2020-04-18 LAB — MAGNESIUM: Magnesium: 2.3 mg/dL (ref 1.7–2.4)

## 2020-04-18 MED ORDER — SODIUM CHLORIDE 0.9% FLUSH: 10.0000 mL | INTRAVENOUS | Status: DC | PRN

## 2020-04-18 MED ORDER — VANCOMYCIN HCL IN DEXTROSE 1-5 GM/200ML-% IV SOLN
1000.0000 mg | Freq: Once | INTRAVENOUS | Status: AC
  Administered 2020-04-19: 1000 mg via INTRAVENOUS
  Filled 2020-04-18: qty 200

## 2020-04-18 MED ORDER — DIPHENHYDRAMINE HCL 50 MG/ML IJ SOLN: 50.0000 mg | Freq: Three times a day (TID) | INTRAMUSCULAR | Status: DC

## 2020-04-18 MED ORDER — FAMOTIDINE IN NACL 20-0.9 MG/50ML-% IV SOLN
20.0000 mg | Freq: Two times a day (BID) | INTRAVENOUS | Status: DC
  Administered 2020-04-18 – 2020-04-19 (×4): 20 mg via INTRAVENOUS
  Filled 2020-04-18 (×4): qty 50

## 2020-04-18 MED ORDER — POTASSIUM CHLORIDE 20 MEQ PO PACK: 40.0000 meq | PACK | Freq: Once | ORAL | Status: DC

## 2020-04-18 MED ORDER — POTASSIUM CHLORIDE 10 MEQ/50ML IV SOLN
10.0000 meq | INTRAVENOUS | Status: AC
  Administered 2020-04-18 (×4): 10 meq via INTRAVENOUS
  Filled 2020-04-18 (×4): qty 50

## 2020-04-18 MED ORDER — SODIUM CHLORIDE 0.9% FLUSH
10.0000 mL | Freq: Two times a day (BID) | INTRAVENOUS | Status: DC
  Administered 2020-04-19 – 2020-04-21 (×5): 10 mL

## 2020-04-18 MED ORDER — METHYLPREDNISOLONE SODIUM SUCC 40 MG IJ SOLR
40.0000 mg | Freq: Every day | INTRAMUSCULAR | Status: AC
  Administered 2020-04-18: 40 mg via INTRAVENOUS
  Filled 2020-04-18: qty 1

## 2020-04-18 MED ORDER — ALBUMIN HUMAN 25 % IV SOLN
12.5000 g | Freq: Every day | INTRAVENOUS | Status: AC
  Administered 2020-04-18 – 2020-04-20 (×3): 12.5 g via INTRAVENOUS
  Filled 2020-04-18 (×3): qty 50

## 2020-04-18 MED ORDER — DIPHENHYDRAMINE HCL 50 MG/ML IJ SOLN: 50.0000 mg | Freq: Three times a day (TID) | INTRAMUSCULAR | Status: DC | PRN

## 2020-04-18 NOTE — Progress Notes (Signed)
CRITICAL CARE NOTE 49 y.o.femalewith a history of diabetes who is brought to the ED due to altered mental status.  She was found on the floor at home covered in feces urine and vomit by her boyfriend.  EMS report the patient is confused, agitated, unable to provide history or engage in medical care. Blood sugar on scene read as "high." Patient with acute resp failure Acute toxic metabolic encephalopathy Intubated for severe resp failure Patient arrived to ICU with severe resp failure and postural decortication  SIGNIFICANT EVENTS 5/13 intubated, sedated, DKA admitted for resp failure 5/14 CVL placed, on pressors, weaned off Insulin drip 5/15 failed weaning trials due to severe encephalopathy, started on precedex 5/16 Meningitis/encephalitis 5/17 - extubated from ventilator on nasal canula supplemental O2 5/18 - patient with no overnight events. Continues to have slow mentation with lethargy follow up respiratory failure  SUBJECTIVE Patient remains critically ill Prognosis is guarded multiorgan failure    BP (!) 153/78   Pulse (!) 111   Temp 98 F (36.7 C) (Oral)   Resp 19   Ht 5' 7.01" (1.702 m)   Wt 132.3 kg   SpO2 94%   BMI 45.67 kg/m    I/O last 3 completed shifts: In: 3706.2 [I.V.:2049.7; IV Piggyback:1656.5] Out: 2990 [Urine:2990] Total I/O In: 1455.1 [P.O.:480; I.V.:163.9; IV Piggyback:811.2] Out: 575 [Urine:575]  SpO2: 94 % O2 Flow Rate (L/min): 2 L/min FiO2 (%): 24 %  Estimated body mass index is 45.67 kg/m as calculated from the following:   Height as of this encounter: 5' 7.01" (1.702 m).   Weight as of this encounter: 132.3 kg.  SIGNIFICANT EVENTS   REVIEW OF SYSTEMS  PATIENT IS UNABLE TO PROVIDE COMPLETE REVIEW OF SYSTEMS DUE TO SEVERE CRITICAL ILLNESS        PHYSICAL EXAMINATION:  GENERAL:critically ill appearing, +resp distress HEAD: Normocephalic, atraumatic.  EYES: Pupils equal, round, reactive to light.  No scleral icterus.   MOUTH: Moist mucosal membrane. NECK: Supple.  PULMONARY: +rhonchi, +wheezing CARDIOVASCULAR: S1 and S2. Regular rate and rhythm. No murmurs, rubs, or gallops.  GASTROINTESTINAL: Soft, nontender, -distended.  Positive bowel sounds.   MUSCULOSKELETAL: No swelling, clubbing, or edema.  NEUROLOGIC: obtunded, GCS<8 SKIN:intact,warm,dry  MEDICATIONS: I have reviewed all medications and confirmed regimen as documented   CULTURE RESULTS   Recent Results (from the past 240 hour(s))  MRSA PCR Screening     Status: None   Collection Time: 04/13/20  9:14 AM   Specimen: Nasopharyngeal  Result Value Ref Range Status   MRSA by PCR NEGATIVE NEGATIVE Final    Comment:        The GeneXpert MRSA Assay (FDA approved for NASAL specimens only), is one component of a comprehensive MRSA colonization surveillance program. It is not intended to diagnose MRSA infection nor to guide or monitor treatment for MRSA infections. Performed at [facility], [address] [place], [place] [address]   Culture, blood (routine x 2)     Status: None   Collection Time: 04/13/20  9:17 AM   Specimen: BLOOD  Result Value Ref Range Status   Specimen Description BLOOD BLOOD RIGHT HAND  Final   Special Requests   Final    BOTTLES DRAWN AEROBIC AND ANAEROBIC Blood Culture adequate volume   Culture   Final    NO GROWTH 5 DAYS Performed at [facility], [address] [place], [place] [address]    Report Status 04/18/2020 FINAL  Final  Culture, blood (routine x 2)  Status: None   Collection Time: 04/13/20  9:17 AM   Specimen: BLOOD  Result Value Ref Range Status   Specimen Description BLOOD BLOOD LEFT HAND  Final   Special Requests   Final    BOTTLES DRAWN AEROBIC AND ANAEROBIC Blood Culture adequate volume   Culture   Final    NO GROWTH 5 DAYS Performed at [facility], [address] [place], [place] [address]    Report Status 04/18/2020 FINAL  Final  SARS Coronavirus 2  by RT PCR (hospital order, performed in [facility] hospital lab) Nasopharyngeal Nasopharyngeal Swab     Status: None   Collection Time: 04/13/20  9:17 AM   Specimen: Nasopharyngeal Swab  Result Value Ref Range Status   SARS Coronavirus 2 NEGATIVE NEGATIVE Final    Comment: (NOTE) SARS-CoV-2 target nucleic acids are NOT DETECTED. The SARS-CoV-2 RNA is generally detectable in upper and lower respiratory specimens during the acute phase of infection. The lowest concentration of SARS-CoV-2 viral copies this assay can detect is 250 copies / mL. A negative result does not preclude SARS-CoV-2 infection and should not be used as the sole basis for treatment or other patient management decisions.  A negative result may occur with improper specimen collection / handling, submission of specimen other than nasopharyngeal swab, presence of viral mutation(s) within the areas targeted by this assay, and inadequate number of viral copies (<250 copies / mL). A negative result must be combined with clinical observations, patient history, and epidemiological information. Fact Sheet for Patients:   StrictlyIdeas.no Fact Sheet for Healthcare Providers: BankingDealers.co.za This test is not yet approved or cleared  by the [place] FDA and has been authorized for detection and/or diagnosis of SARS-CoV-2 by FDA under an Emergency Use Authorization (EUA).  This EUA will remain in effect (meaning this test can be used) for the duration of the COVID-19 declaration under Section 564(b)(1) of the Act, 21 U.S.C. section 360bbb-3(b)(1), unless the authorization is terminated or revoked sooner. Performed at [facility], [address], [place], [place] [address]   Urine culture     Status: None   Collection Time: 04/13/20 10:27 AM   Specimen: Urine, Random  Result Value Ref Range Status   Specimen Description   Final    URINE, RANDOM Performed at  [facility], [address] [place], [place] [address]    Special Requests   Final    NONE Performed at [facility], [address] [place], [place] [address]    Culture   Final    NO GROWTH Performed at [facility], [place] [address], [place], [place] [address]    Report Status 04/14/2020 FINAL  Final  CSF culture     Status: None   Collection Time: 04/14/20  2:00 PM   Specimen: CSF; Cerebrospinal Fluid  Result Value Ref Range Status   Specimen Description   Final    CSF Performed at [facility], [address] [place], [place] [address]    Special Requests   Final    Normal Performed at [facility], [place], [place] [address]    Gram Stain WBC SEEN RED BLOOD CELLS NO ORGANISMS SEEN   Final   Culture   Final    NO GROWTH Performed at [facility], [place] [address], [place],  [address]    Report Status 04/17/2020 FINAL  Final  Anaerobic culture     Status: None (Preliminary  result)   Collection Time: 04/14/20  4:38 PM   Specimen: PATH Cytology CSF; Cerebrospinal Fluid  Result Value Ref Range Status   Specimen Description   Final    CSF Performed at [facility], [address] [place], [place] [address]    Special Requests   Final    NONE Performed at [facility], [address] [place], [place] [address]    Culture   Final    NO ANAEROBES ISOLATED; CULTURE IN PROGRESS FOR 5 DAYS   Report Status PENDING  Incomplete  Gram stain     Status: None   Collection Time: 04/14/20  4:38 PM   Specimen: PATH Cytology CSF; Cerebrospinal Fluid  Result Value Ref Range Status   Specimen Description CSF  Final   Special Requests NONE  Final   Gram Stain   Final    NO ORGANISMS SEEN WBC SEEN RED BLOOD CELLS SEEN Performed at [facility], [address] [place], [place] [address]    Report Status 04/15/2020 FINAL  Final  Culture, fungus without smear     Status:  None (Preliminary result)   Collection Time: 04/14/20  4:38 PM   Specimen: PATH Cytology CSF; Cerebrospinal Fluid  Result Value Ref Range Status   Specimen Description   Final    CSF Performed at [facility], [address], [place], [place] [address]    Special Requests   Final    NONE Performed at [facility], [address] [place], [place] [address]    Culture   Final    NO GROWTH 3 DAYS Performed at [facility], [place] [address], [place], [place] [address]    Report Status PENDING  Incomplete        CBC    Component Value Date/Time   WBC 14.0 (H) 04/18/2020 0425   RBC 3.65 (L) 04/18/2020 0425   HGB 11.4 (L) 04/18/2020 0425   HCT 35.1 (L) 04/18/2020 0425   PLT 223 04/18/2020 0425   MCV 96.2 04/18/2020 0425   MCH 31.2 04/18/2020 0425   MCHC 32.5 04/18/2020 0425   RDW 13.2 04/18/2020 0425   LYMPHSABS 3.0 04/18/2020 0425   MONOABS 2.1 (H) 04/18/2020 0425   EOSABS 0.5 04/18/2020 0425   BASOSABS 0.0 04/18/2020 0425   BMP Latest Ref Rng & Units 04/18/2020 04/17/2020 04/16/2020  Glucose 70 - 99 mg/dL 165(H) 151(H) 170(H)  BUN 6 - 20 mg/dL 45(H) 46(H) 43(H)  Creatinine 0.44 - 1.00 mg/dL 1.01(H) 0.91 0.90  Sodium 135 - 145 mmol/L 142 141 140  Potassium 3.5 - 5.1 mmol/L 3.5 3.4(L) 3.6  Chloride 98 - 111 mmol/L 112(H) 110 112(H)  CO2 22 - 32 mmol/L 23 22 21(L)  Calcium 8.9 - 10.3 mg/dL 7.7(L) 8.1(L) 7.5(L)      IMAGING    DG Chest Port 1 View  Result Date: 04/18/2020 CLINICAL DATA:  Status postextubation EXAM: PORTABLE CHEST 1 VIEW COMPARISON:  Apr 14, 2020 FINDINGS: Endotracheal tube and nasogastric tube have been removed. Central catheter tip is in the superior vena cava. No pneumothorax. Lungs are now clear. Heart is borderline enlarged with pulmonary vascularity normal. No adenopathy. No bone lesions. IMPRESSION: Central catheter tip in superior vena cava. No pneumothorax. Lungs clear. Borderline cardiomegaly. Electronically Signed   By:  Lowella Grip III M.D.   On: 04/18/2020 11:24     Nutrition Status: Nutrition Problem: Inadequate oral intake Etiology: inability to eat Signs/Symptoms: NPO status Interventions: Tube feeding,  Prostat, MVI     Indwelling Urinary Catheter continued, requirement due to   Reason to continue Indwelling Urinary Catheter strict Intake/Output monitoring for hemodynamic instability   Central Line/ continued, requirement due to  Reason to continue [place] of central venous pressure or other hemodynamic parameters and poor IV access   Ventilator continued, requirement due to severe respiratory failure   Ventilator Sedation RASS 0 to -2      ASSESSMENT AND PLAN SYNOPSIS  Severe ACUTE Hypoxic and Hypercapnic Respiratory Failuredue severe acidosis due to DKA with toxic metabolic encephalopathycomplicated by fevers etiology probable c/w CAP SEPSIS due to MENINGITIS/ENCEPHALITIS s/p CSF fluid eval - atypical profile with lymphocytic predominant leukocytosis  Severe ACUTE Hypoxic and Hypercapnic Respiratory Failure -continue Full MV support -continue Bronchodilator Therapy -Wean Fio2 and PEEP as tolerated -will perform SAT/SBT when respiratory parameters are met -VAP/VENT bundle implementation -extubated 04/17/20  Morbid obesity, possible OSA.   Will certainly impact respiratory mechanics, ventilator weaning Suspect will need to consider additional PEEP, possible extubation to BiPAP when appropriate to consider   ACUTE KIDNEY INJURY/Renal Failure -continue Foley Catheter-assess need -Avoid nephrotoxic agents -Follow urine output, BMP -Ensure adequate renal perfusion, optimize oxygenation -Renal dose medications     NEUROLOGY -extubated 5/17 Acute toxic metabolic encephalopathy, need for sedation Goal RASS -2 to -3   CARDIAC ICU monitoring  ID DX of CAP and MENINGITIS AND ENCEPHALITIS -continue IV abx as prescibed -follow up cultures  GI GI  PROPHYLAXIS as indicated  NUTRITIONAL STATUS Nutrition Status: Nutrition Problem: Inadequate oral intake Etiology: inability to eat Signs/Symptoms: NPO status Interventions: Tube feeding, Prostat, MVI   DIET-->TF's as tolerated Constipation protocol as indicated  ENDO - will use ICU hypoglycemic\Hyperglycemia protocol if indicated     ELECTROLYTES -follow labs as needed -replace as needed -pharmacy consultation and following   DVT/GI PRX ordered and assessed TRANSFUSIONS AS NEEDED MONITOR FSBS I Assessed the need for Labs I Assessed the need for Foley I Assessed the need for Central Venous Line Family Discussion when available I Assessed the need for Mobilization I made an Assessment of medications to be adjusted accordingly Safety Risk assessment completed   CASE DISCUSSED IN MULTIDISCIPLINARY ROUNDS WITH ICU TEAM  Critical care provider statement:    Critical care time (minutes):  33   Critical care time was exclusive of:  Separately billable procedures and  treating other patients   Critical care was necessary to treat or prevent imminent or  life-threatening deterioration of the following conditions:  Meningitis, sepsis, acute hypoxemic respiratory failure, multiple comorbid conditions.    Critical care was time spent personally by me on the following  activities:  Development of treatment plan with patient or surrogate,  discussions with consultants, evaluation of patient's response to  treatment, examination of patient, obtaining history from patient or  surrogate, ordering and performing treatments and interventions, ordering  and review of laboratory studies and re-evaluation of patient's condition   I assumed direction of critical care for this patient from another  provider in my specialty: no    Overall, patient is critically ill, prognosis is guarded.  Patient with Multiorgan failure and at high risk for cardiac arrest and death.     Ottie Glazier, M.D.  Pulmonary & [place]

## 2020-04-18 NOTE — Progress Notes (Signed)
Nutrition Follow-up  DOCUMENTATION CODES:   Obesity unspecified  INTERVENTION:  Will monitor for diet advancement. Patient will benefit from an oral nutrition supplement once diet is able to be advanced.  NUTRITION DIAGNOSIS:   Inadequate oral intake related to inability to eat as evidenced by NPO status.  Ongoing.  GOAL:   Patient will meet greater than or equal to 90% of their needs  Not met.  MONITOR:   Vent status, Labs, Weight trends, TF tolerance, I & O's  REASON FOR ASSESSMENT:   Ventilator    ASSESSMENT:   49 year old female with PMHx of DM admitted with acute hypoxic and hypercapnic respiratory failure due to severe acidosis requiring mechanical intubation, DKA, toxic metabolic encephalopathy, sepsis, likely PNA, acute systolic cardiac failure, AKI.  Patient was extubated on 5/17. Per discussion on rounds patient is still being weaned down on Precedex gtt and norepinephrine gtt. Plan is for SLP evaluation once patient is more alert, possibly tomorrow. Assessed patient at bedside following rounds. She was sleepy but arousable. Discussed that RD will continue to follow and monitor PO intake once diet is advanced.  Medications reviewed and include: Colace 100 mg BID, Novolog 0-20 units Q4hrs, Levemir 0.3 units/kg QHS, liquid MVI daily per tube, acyclovir, human albumin, ceftriaxone, Precedex gtt, famotidine, norepinephrine gtt, potassium chloride 10 mEq IV x 4 today, vancomycin.  Labs reviewed: CBG 91-144, Chloride 112, BUN 45, Creatinine 1.01.  Diet Order:   Diet Order            Diet NPO time specified  Diet effective now             EDUCATION NEEDS:   No education needs have been identified at this time  Skin:  Skin Assessment: Reviewed RN Assessment  Last BM:  04/17/2020 large type 5  Height:   Ht Readings from Last 1 Encounters:  04/15/20 5' 7.01" (1.702 m)   Weight:   Wt Readings from Last 1 Encounters:  04/18/20 132.3 kg   Ideal Body  Weight:  61.4 kg  BMI:  Body mass index is 45.67 kg/m.  Estimated Nutritional Needs:   Kcal:  2300-2500  Protein:  >/= 130 grams  Fluid:  1.8-2.1 L/day  Jacklynn Barnacle, MS, RD, LDN Pager number available on Amion

## 2020-04-18 NOTE — Evaluation (Signed)
Occupational Therapy Evaluation Patient Details Name: Jessica Santiago MRN: [id number] DOB: [birth year] Today's Date: 04/18/2020    History of Present Illness Pt is 49 y/o F with PMH: DM. on 04/13/20, Pt was found soiled and down at home by her boyfriend. Was agitated on EMS arrival with blood glucose reading as high. BG was at 480 in ED after some fluid. Pt with DKA and ultiamtely intubated d/t respiratory failure as well. Failed weaning attempts on 5/15 d/t encephalopathy and was on precedex. Pt extubated 5/17. Now on room air.   Clinical Impression   Pt was seen for OT evaluation this date. Prior to hospital admission, pt reports being Indep with all aspects of self care ADLs and IADLs including driving and getting groceries. Pt reports living in mobile home with her 3 sons with 3 STE. Currently pt demonstrates impairments as described below (See OT problem list) which functionally limit her ability to perform ADL/self-care tasks. Pt currently requires MOD A for most aspects of bed mobility and MOD A for SPS from EOB to chair. In addition, pt requiring setup for UB ADLs and MAX A for LB ADLs at this time d/t general weakness.  Pt would benefit from skilled OT to address noted impairments and functional limitations (see below for any additional details) in order to maximize safety and independence while minimizing falls risk and caregiver burden. Upon hospital discharge, recommend CIR to maximize pt safety and return to PLOF. Of note, Pt's HR at 104 before activity, gets to 125 with activity, back to ~110 while sitting in chair after transfer.    Follow Up Recommendations  CIR    Equipment Recommendations  3 in 1 bedside commode;Tub/shower seat    Recommendations for Other Services       Precautions / Restrictions Precautions Precautions: Fall Restrictions Weight Bearing Restrictions: No Other Position/Activity Restrictions: several lines/leads including L IJ      Mobility Bed  Mobility Overal bed mobility: Needs Assistance Bed Mobility: Supine to Sit;Rolling Rolling: Mod assist   Supine to sit: Mod assist        Transfers Overall transfer level: Needs assistance   Transfers: Stand Pivot Transfers   Stand pivot transfers: Mod assist       General transfer comment: MIN verbal/tactile cues for safety. Would benefit from 2p for line mgt    Balance Overall balance assessment: Needs assistance Sitting-balance support: Single extremity supported Sitting balance-Leahy Scale: Fair     Standing balance support: Bilateral upper extremity supported Standing balance-Leahy Scale: Fair                             ADL either performed or assessed with clinical judgement   ADL Overall ADL's : Needs assistance/impaired Eating/Feeding: Set up;Sitting Eating/Feeding Details (indicate cue type and reason): based on clinical observation Grooming: Wash/dry hands;Wash/dry face;Set up;Sitting           Upper Body Dressing : Minimal assistance;Moderate assistance;Sitting   Lower Body Dressing: Maximal assistance;Sitting/lateral leans       Toileting- Clothing Manipulation and Hygiene: Maximal assistance;Bed level Toileting - Clothing Manipulation Details (indicate cue type and reason): using lateral rolling technique, pt did not seem to be aware of being soiled.             Vision   Additional Comments: difficult to formally assess this date, but pt tracks functionally throughout session     Perception     Praxis  Pertinent Vitals/Pain Pain Assessment: (vague mention of sore throat, but no pain impacting mobility at this time.)     Hand Dominance     Extremity/Trunk Assessment Upper Extremity Assessment Upper Extremity Assessment: RUE deficits/detail;LUE deficits/detail RUE Deficits / Details: shld, elbow, grip MMT 4-/5, pt with significant edema of UEs. LUE Deficits / Details: shld, elbow, grip MMT 4-/5, pt with significant  edema of UEs.   Lower Extremity Assessment Lower Extremity Assessment: Defer to PT evaluation;Overall [facility] for tasks assessed;Generalized weakness       Communication Communication Communication: Other (comment)(pt extubated, scratchy/whisper voice at this time.)   Cognition Arousal/Alertness: Awake/alert Behavior During Therapy: WFL for tasks assessed/performed;Impulsive Overall Cognitive Status: No family/caregiver present to determine baseline cognitive functioning                                 General Comments: Pt is able to follow 1-2 step simple commands with increased processing time. Mostly appropriate, some impulsivity requiring tactile cues with SPS to chair. Pt oriented to self, year, and location-hospital (but first guesses [facility]), but not situation or other aspects of time.   General Comments       Exercises Other Exercises Other Exercises: OT facilitates educatino re: role of OT in acute setting. Pt with moderate reception detected. In addition, OT facilitates education re: importance of OOB activity and pt agreeable.   Shoulder Instructions      Home Living Family/patient expects to be discharged to:: Private residence Living Arrangements: Children(3 sons) Available Help at Discharge: Family Type of Home: Mobile home Home Access: Stairs to enter Entrance Stairs-Number of Steps: 4 Entrance Stairs-Rails: Right;Left;Can reach both Home Layout: One level     Bathroom Shower/Tub: Walk-in shower         Home Equipment: Environmental consultant - 4 wheels;Cane - single point          Prior Functioning/Environment Level of Independence: Independent        Comments: Pt is somewhat poor historian at this time, she is oriented to year and some aspects of location (hospital) so unsure of efficacy of above-captured PLOF/home setup. No family present to verify at this time.        OT Problem List: Decreased strength;Decreased activity tolerance;Decreased  cognition;Decreased safety awareness;Decreased knowledge of use of DME or AE;Cardiopulmonary status limiting activity;Obesity;Impaired balance (sitting and/or standing)      OT Treatment/Interventions: Self-care/ADL training;Therapeutic exercise;Energy conservation;DME and/or AE instruction;Therapeutic activities;Patient/family education;Balance training    OT Goals(Current goals can be found in the care plan section) Acute Rehab OT Goals Patient Stated Goal: to get out of here" OT Goal Formulation: With patient Time For Goal Achievement: 05/02/20 Potential to Achieve Goals: Good  OT Frequency: Min 2X/week   Barriers to D/C:            Co-evaluation              AM-PAC OT "6 Clicks" Daily Activity     Outcome Measure Help from another person eating meals?: A Little Help from another person taking care of personal grooming?: A Little Help from another person toileting, which includes using toliet, bedpan, or urinal?: A Lot Help from another person bathing (including washing, rinsing, drying)?: A Lot Help from another person to put on and taking off regular upper body clothing?: A Little Help from another person to put on and taking off regular lower body clothing?: A Lot 6 Click Score: 15  End of Session Equipment Utilized During Treatment: Gait belt Nurse Communication: Mobility status  Activity Tolerance: Patient tolerated treatment well Patient left: in chair;with call bell/phone within reach;Other (comment)(chair alarm under patient and RN notified that she needs a POSEY box to attach to.)  OT Visit Diagnosis: Unsteadiness on feet (R26.81);Muscle weakness (generalized) (M62.81)                Time: 7858-8502 OT Time Calculation (min): 68 min Charges:  OT General Charges $OT Visit: 1 Visit OT Evaluation $OT Eval Moderate Complexity: 1 Mod OT Treatments $Self Care/Home Management : 23-37 mins $Therapeutic Activity: 23-37 mins  Gerrianne Scale, MS, OTR/L ascom  [phone] 04/18/20, 4:25 PM

## 2020-04-18 NOTE — Progress Notes (Signed)
A&OX3, disoriented to situation. Follows commands. VSS. Temp checked axillary or oral, Foley temp inaccurately low. Levophed turned off and pt BP is adequate. Precedex titrated off. Pt is anxious and withdrawn, states that she is afraid to sleep because she thinks we are going to "kill her". RN speaks therapeutically to pt about what we are doing and why she is here, and that we are trying to help her get better. Pt verbalizes understanding but still appears withdrawn. Hoarse voice. Adequate urine output via foley. Up to chair with OT.

## 2020-04-18 NOTE — Progress Notes (Signed)
Subjective: S/p extubation. Awake and following commands.    Objective: Current vital signs: BP (!) 124/55   Pulse (!) 102   Temp 97.9 F (36.6 C) (Oral)   Resp 18   Ht 5' 7.01" (1.702 m)   Wt 132.3 kg   SpO2 95%   BMI 45.67 kg/m  Vital signs in last 24 hours: Temp:  [97 F (36.1 C)-99.1 F (37.3 C)] 97.9 F (36.6 C) (05/18 1415) Pulse Rate:  [49-136] 102 (05/18 1415) Resp:  [8-25] 18 (05/18 1415) BP: (84-161)/(45-93) 124/55 (05/18 1415) SpO2:  [92 %-100 %] 95 % (05/18 1415) Weight:  [132.3 kg] 132.3 kg (05/18 0500)  Intake/Output from previous day: 05/17 0701 - 05/18 0700 In: 1972.8 [I.V.:1207.7; IV Piggyback:765.1] Out: 2290 [Urine:2290] Intake/Output this shift: Total I/O In: 975.1 [I.V.:163.9; IV Piggyback:811.2] Out: 575 [Urine:575] Nutritional status:  Diet Order            Diet NPO time specified  Diet effective now              Neurological Examination   Mental Status: Alert, oriented slow to respond.  Cranial Nerves: II: Discs flat bilaterally; Visual fields grossly normal, pupils equal, round, reactive to light and accommodation III,IV, VI: ptosis not present, extra-ocular motions intact bilaterally V,VII: smile symmetric, facial light touch sensation normal bilaterally VIII: hearing normal bilaterally IX,X: gag reflex present XI: bilateral shoulder shrug XII: midline tongue extension Motor: Right : generalized weakness      Lab Results: Basic Metabolic Panel: Recent Labs  Lab 04/15/20 0442 04/15/20 0442 04/15/20 1938 04/15/20 1938 04/16/20 0458 04/16/20 0458 04/16/20 1738 04/17/20 0602 04/18/20 0425  NA 140   < > 139  --  140  --  140 141 142  K 3.8   < > 4.2  --  3.9  --  3.6 3.4* 3.5  CL 109   < > 109  --  110  --  112* 110 112*  CO2 24   < > 23  --  24  --  21* 22 23  GLUCOSE 259*   < > 330*  --  195*  --  170* 151* 165*  BUN 30*   < > 40*  --  43*  --  43* 46* 45*  CREATININE 0.94   < > 1.02*  --  1.10*  --  0.90 0.91 1.01*   CALCIUM 7.6*   < > 8.0*   < > 8.0*   < > 7.5* 8.1* 7.7*  MG 1.7  --   --   --  2.4  --  2.3 2.2 2.3  PHOS 2.7  --   --   --  3.4  --  3.2 3.2 2.6   < > = values in this interval not displayed.    Liver Function Tests: Recent Labs  Lab 04/13/20 0917 04/18/20 0425  AST 19  --   ALT 17  --   ALKPHOS 72  --   BILITOT 2.1*  --   PROT 7.1  --   ALBUMIN 3.4* 1.8*   Recent Labs  Lab 04/13/20 0917  LIPASE 24   No results for input(s): AMMONIA in the last 168 hours.  CBC: Recent Labs  Lab 04/13/20 0917 04/13/20 0917 04/14/20 0336 04/15/20 0442 04/16/20 0458 04/17/20 0602 04/18/20 0425  WBC 20.7*   < > 17.5* 10.4 10.0 23.0* 14.0*  NEUTROABS 18.2*  --   --  8.7* 8.0* 14.9* 8.3*  HGB 12.9   < >  10.3* 11.0* 13.1 15.5* 11.4*  HCT 38.6   < > 30.6* 33.5* 40.2 46.7* 35.1*  MCV 93.2   < > 93.0 93.8 93.3 94.5 96.2  PLT 358   < > 263 253 291 411* 223   < > = values in this interval not displayed.    Cardiac Enzymes: No results for input(s): CKTOTAL, CKMB, CKMBINDEX, TROPONINI in the last 168 hours.  Lipid Panel: Recent Labs  Lab 04/14/20 1101  TRIG 118    CBG: Recent Labs  Lab 04/17/20 1929 04/17/20 2353 04/18/20 0356 04/18/20 0730 04/18/20 1120  GLUCAP 205* 160* 125* 91 144*    Microbiology: Results for orders placed or performed during the hospital encounter of 04/13/20  MRSA PCR Screening     Status: None   Collection Time: 04/13/20  9:14 AM   Specimen: Nasopharyngeal  Result Value Ref Range Status   MRSA by PCR NEGATIVE NEGATIVE Final    Comment:        The GeneXpert MRSA Assay (FDA approved for NASAL specimens only), is one component of a comprehensive MRSA colonization surveillance program. It is not intended to diagnose MRSA infection nor to guide or monitor treatment for MRSA infections. Performed at [facility], [address] [place], [place] [address]   Culture, blood (routine x 2)     Status: None   Collection Time: 04/13/20   9:17 AM   Specimen: BLOOD  Result Value Ref Range Status   Specimen Description BLOOD BLOOD RIGHT HAND  Final   Special Requests   Final    BOTTLES DRAWN AEROBIC AND ANAEROBIC Blood Culture adequate volume   Culture   Final    NO GROWTH 5 DAYS Performed at [facility], [address], [place], [place] [address]    Report Status 04/18/2020 FINAL  Final  Culture, blood (routine x 2)     Status: None   Collection Time: 04/13/20  9:17 AM   Specimen: BLOOD  Result Value Ref Range Status   Specimen Description BLOOD BLOOD LEFT HAND  Final   Special Requests   Final    BOTTLES DRAWN AEROBIC AND ANAEROBIC Blood Culture adequate volume   Culture   Final    NO GROWTH 5 DAYS Performed at [facility], [address], [place], [place] [address]    Report Status 04/18/2020 FINAL  Final  SARS Coronavirus 2 by RT PCR (hospital order, performed in [facility] hospital lab) Nasopharyngeal Nasopharyngeal Swab     Status: None   Collection Time: 04/13/20  9:17 AM   Specimen: Nasopharyngeal Swab  Result Value Ref Range Status   SARS Coronavirus 2 NEGATIVE NEGATIVE Final    Comment: (NOTE) SARS-CoV-2 target nucleic acids are NOT DETECTED. The SARS-CoV-2 RNA is generally detectable in upper and lower respiratory specimens during the acute phase of infection. The lowest concentration of SARS-CoV-2 viral copies this assay can detect is 250 copies / mL. A negative result does not preclude SARS-CoV-2 infection and should not be used as the sole basis for treatment or other patient management decisions.  A negative result may occur with improper specimen collection / handling, submission of specimen other than nasopharyngeal swab, presence of viral mutation(s) within the areas targeted by this assay, and inadequate number of viral copies (<250 copies / mL). A negative result must be combined with clinical observations, patient history, and epidemiological information. Fact Sheet  for Patients:   BoilerBrush.com.cy Fact Sheet for Healthcare Providers: https://pope.com/ This test is not  yet approved or cleared  by the [place] and has been authorized for detection and/or diagnosis of SARS-CoV-2 by FDA under an Emergency Use Authorization (EUA).  This EUA will remain in effect (meaning this test can be used) for the duration of the COVID-19 declaration under Section 564(b)(1) of the Act, 21 U.S.C. section 360bbb-3(b)(1), unless the authorization is terminated or revoked sooner. Performed at [facility], [address], [place], [place] [address]   Urine culture     Status: None   Collection Time: 04/13/20 10:27 AM   Specimen: Urine, Random  Result Value Ref Range Status   Specimen Description   Final    URINE, RANDOM Performed at [facility], [address] [place], [place] [address]    Special Requests   Final    NONE Performed at [facility], [address], [place], [place] [address]    Culture   Final    NO GROWTH Performed at [facility], [place] [address], [place], [place] [address]    Report Status 04/14/2020 FINAL  Final  CSF culture     Status: None   Collection Time: 04/14/20  2:00 PM   Specimen: CSF; Cerebrospinal Fluid  Result Value Ref Range Status   Specimen Description   Final    CSF Performed at [facility], [address] [place], [place] [address]    Special Requests   Final    Normal Performed at [facility], [place], [place] [address]    Gram Stain WBC SEEN RED BLOOD CELLS NO ORGANISMS SEEN   Final   Culture   Final    NO GROWTH Performed at [facility], [place] [address] [place], [place] [address]    Report Status 04/17/2020 FINAL  Final  Anaerobic culture     Status: None (Preliminary result)   Collection Time: 04/14/20  4:38 PM   Specimen: PATH Cytology CSF; Cerebrospinal Fluid   Result Value Ref Range Status   Specimen Description   Final    CSF Performed at [facility], [address], [place], [place] [address]    Special Requests   Final    NONE Performed at [facility], [address] [place], [place] [address]    Culture   Final    NO ANAEROBES ISOLATED; CULTURE IN PROGRESS FOR 5 DAYS   Report Status PENDING  Incomplete  Gram stain     Status: None   Collection Time: 04/14/20  4:38 PM   Specimen: PATH Cytology CSF; Cerebrospinal Fluid  Result Value Ref Range Status   Specimen Description CSF  Final   Special Requests NONE  Final   Gram Stain   Final    NO ORGANISMS SEEN WBC SEEN RED BLOOD CELLS SEEN Performed at [facility], [address] [place], [place] [address]    Report Status 04/15/2020 FINAL  Final  Culture, fungus without smear     Status: None (Preliminary result)   Collection Time: 04/14/20  4:38 PM   Specimen: PATH Cytology CSF; Cerebrospinal Fluid  Result Value Ref Range Status   Specimen Description   Final    CSF Performed at [facility], [address] [place], [place] [address]    Special Requests   Final    NONE Performed at [facility], [address] [place], [place] [address]    Culture   Final    NO GROWTH 3  DAYS Performed at [facility], [address], [place], [place] [address]    Report Status PENDING  Incomplete    Coagulation Studies: No results for input(s): LABPROT, INR in the last 72 hours.  Imaging: DG Chest Port 1 View  Result Date: 04/18/2020 CLINICAL DATA:  Status postextubation EXAM: PORTABLE CHEST 1 VIEW COMPARISON:  Apr 14, 2020 FINDINGS: Endotracheal tube and nasogastric tube have been removed. Central catheter tip is in the superior vena cava. No pneumothorax. Lungs are now clear. Heart is borderline enlarged with pulmonary vascularity normal. No adenopathy. No bone lesions. IMPRESSION: Central catheter tip in superior vena cava.  No pneumothorax. Lungs clear. Borderline cardiomegaly. Electronically Signed   By: Bretta Bang III M.D.   On: 04/18/2020 11:24    Medications:  I have reviewed the patient's current medications. Scheduled: . amLODipine  5 mg Per Tube Daily  . chlorhexidine  15 mL Mouth Rinse BID  . Chlorhexidine Gluconate Cloth  6 each Topical Daily  . diphenhydrAMINE  50 mg Intravenous Q8H  . docusate  100 mg Per Tube BID  . enoxaparin (LOVENOX) injection  40 mg Subcutaneous Q12H  . insulin aspart  0-20 Units Subcutaneous Q4H  . insulin detemir  0.3 Units/kg Subcutaneous QHS  . LORazepam  4 mg Intravenous Once  . mouth rinse  15 mL Mouth Rinse q12n4p  . multivitamin  15 mL Per Tube Daily  . sodium chloride flush  3 mL Intravenous Q12H    Assessment/Plan: 49 y.o. female with a history of diabetes and was brought to the ED due to altered mental status. Initial blood sugar here of 589, found to be in DKA, ?sepsis.  Due to inability to protect her airway the patient was intubated and sedated.  Noted to have posturing, described as decerebrate.  Presentation very likely metabolic in etiology.  Will rule out other possible etiologies.  Head CT personally reviewed and shows no acute changes.  EEG shows burst suppression activity but that is when on significant sedation   - s/p extubation - following commands - think we can d/c acyclovir today and antibiotics tomorrow for total 5 days if she continues to improve.      04/18/2020  2:27 PM

## 2020-04-18 NOTE — Progress Notes (Addendum)
PHARMACY CONSULT NOTE  Pharmacy Consult for Electrolyte Monitoring and Replacement   Recent Labs: Potassium (mmol/L)  Date Value  04/18/2020 3.5   Magnesium (mg/dL)  Date Value  70/34/0352 2.3   Calcium (mg/dL)  Date Value  48/18/5909 7.7 (L)   Albumin (g/dL)  Date Value  31/11/1623 1.8 (L)   Phosphorus (mg/dL)  Date Value  46/95/0722 2.6   Sodium (mmol/L)  Date Value  04/18/2020 142    Assessment: 49 year old female admitted with DKA (now resolved), and also with respiratory failure with concern for meningitis/encephalitis requiring intubation.  Patient extubated 04/17/20.  Pharmacy to manage electrolytes.  Goal of Therapy:  Potassium ~4 and magnesium ~2.  All other electrolytes WNL.  Plan:  Will give KCl 10 mEq q1 hr x 4 doses.  Electrolytes with AM labs.  Cherly Hensen ,PharmD 04/18/2020 2:12 PM

## 2020-04-19 ENCOUNTER — Other Ambulatory Visit: Payer: Self-pay

## 2020-04-19 ENCOUNTER — Encounter: Payer: Self-pay | Admitting: Internal Medicine

## 2020-04-19 LAB — ALBUMIN: Albumin: 2 g/dL — ABNORMAL LOW (ref 3.5–5.0)

## 2020-04-19 LAB — CBC WITH DIFFERENTIAL/PLATELET
Abs Immature Granulocytes: 0.11 10*3/uL — ABNORMAL HIGH (ref 0.00–0.07)
Basophils Absolute: 0 10*3/uL (ref 0.0–0.1)
Basophils Relative: 0 %
Eosinophils Absolute: 0.4 10*3/uL (ref 0.0–0.5)
Eosinophils Relative: 3 %
HCT: 33.6 % — ABNORMAL LOW (ref 36.0–46.0)
Hemoglobin: 10.7 g/dL — ABNORMAL LOW (ref 12.0–15.0)
Immature Granulocytes: 1 %
Lymphocytes Relative: 22 %
Lymphs Abs: 2.5 10*3/uL (ref 0.7–4.0)
MCH: 30.7 pg (ref 26.0–34.0)
MCHC: 31.8 g/dL (ref 30.0–36.0)
MCV: 96.3 fL (ref 80.0–100.0)
Monocytes Absolute: 1.5 10*3/uL — ABNORMAL HIGH (ref 0.1–1.0)
Monocytes Relative: 13 %
Neutro Abs: 7.1 10*3/uL (ref 1.7–7.7)
Neutrophils Relative %: 61 %
Platelets: 242 10*3/uL (ref 150–400)
RBC: 3.49 MIL/uL — ABNORMAL LOW (ref 3.87–5.11)
RDW: 13.2 % (ref 11.5–15.5)
WBC: 11.7 10*3/uL — ABNORMAL HIGH (ref 4.0–10.5)
nRBC: 0 % (ref 0.0–0.2)

## 2020-04-19 LAB — BASIC METABOLIC PANEL
Anion gap: 6 (ref 5–15)
BUN: 35 mg/dL — ABNORMAL HIGH (ref 6–20)
CO2: 26 mmol/L (ref 22–32)
Calcium: 7.9 mg/dL — ABNORMAL LOW (ref 8.9–10.3)
Chloride: 113 mmol/L — ABNORMAL HIGH (ref 98–111)
Creatinine, Ser: 0.86 mg/dL (ref 0.44–1.00)
GFR calc Af Amer: >60 mL/min (ref >60)
GFR calc non Af Amer: >60 mL/min (ref >60)
Glucose, Bld: 111 mg/dL — ABNORMAL HIGH (ref 70–99)
Potassium: 3.7 mmol/L (ref 3.5–5.1)
Sodium: 145 mmol/L (ref 135–145)

## 2020-04-19 LAB — GLUCOSE, CAPILLARY
Glucose-Capillary: 151 mg/dL — ABNORMAL HIGH (ref 70–99)
Glucose-Capillary: 103 mg/dL — ABNORMAL HIGH (ref 70–99)
Glucose-Capillary: 76 mg/dL (ref 70–99)
Glucose-Capillary: 89 mg/dL (ref 70–99)
Glucose-Capillary: 69 mg/dL — ABNORMAL LOW (ref 70–99)
Glucose-Capillary: 100 mg/dL — ABNORMAL HIGH (ref 70–99)
Glucose-Capillary: 127 mg/dL — ABNORMAL HIGH (ref 70–99)
Glucose-Capillary: 201 mg/dL — ABNORMAL HIGH (ref 70–99)

## 2020-04-19 LAB — OLIGOCLONAL BANDS, CSF + SERM

## 2020-04-19 LAB — VDRL, CSF: VDRL Quant, CSF: NONREACTIVE

## 2020-04-19 LAB — MAGNESIUM: Magnesium: 2.3 mg/dL (ref 1.7–2.4)

## 2020-04-19 LAB — PHOSPHORUS: Phosphorus: 2.5 mg/dL (ref 2.5–4.6)

## 2020-04-19 MED ORDER — INSULIN DETEMIR 100 UNIT/ML ~~LOC~~ SOLN
20.0000 [IU] | Freq: Every day | SUBCUTANEOUS | Status: DC
  Administered 2020-04-19 – 2020-04-20 (×2): 20 [IU] via SUBCUTANEOUS
  Filled 2020-04-19 (×3): qty 0.2

## 2020-04-19 MED ORDER — PNEUMOCOCCAL VAC POLYVALENT 25 MCG/0.5ML IJ INJ
0.5000 mL | INJECTION | INTRAMUSCULAR | Status: AC
  Administered 2020-04-21: 0.5 mL via INTRAMUSCULAR
  Filled 2020-04-19: qty 0.5

## 2020-04-19 MED ORDER — DEXTROSE 50 % IV SOLN
12.5000 g | INTRAVENOUS | Status: AC
  Administered 2020-04-19: 12.5 g via INTRAVENOUS

## 2020-04-19 NOTE — Plan of Care (Signed)

## 2020-04-19 NOTE — Progress Notes (Signed)
Inpatient Rehab Admissions Coordinator Note:   Per OT recommendations, pt was screened for CIR candidacy by Estill Dooms, PT, DPT.  At this time we are recommending a CIR consult.  I will place an order per our protocol.  Please contact me with questions.   Estill Dooms, PT, DPT [phone] 04/19/20 2:34 PM

## 2020-04-19 NOTE — Evaluation (Addendum)
Physical Therapy Evaluation Patient Details Name: Jessica Santiago MRN: [id number] DOB: 1971/06/30 Today's Date: 04/19/2020   History of Present Illness  Jessica Santiago is a 60yoF who comes to [facility] on 5/13 c AMS. Pt found by boyfriend at home, was on floor and soiled of urine, feces, and emesis. Pt intubated upon arrival due to acute respiratory failure, admitted with metabolic encephalopathy. PMH: IDDM-1. ICU notes show meningitis on 5/16, successful extubation on 5/17, continued AMS, lethargy.  Clinical Impression  Pt admitted with above diagnosis. Pt currently with functional limitations due to the deficits listed below (see "PT Problem List"). Upon entry, pt in bed, awake and agreeable to participate. The pt is awake, but drowsy, delayed response time and delayed initiation. Pt largely able to provide PLOF detail with fluctuating details regarding her sons' ages. Pt generally weak, heavy effort required coming to EOB, but no physical assist. MinA to rise from elevated surface, modA for SPT, then Pt able to STS from recliner pulling self up using BUE on chair back placed directly in front of her. Pt remains drowsy, but also has some quiet tearfulness toward end of session, does not elaborate. Functional mobility assessment demonstrates increased effort/time requirements, poor tolerance, and need for physical assistance, whereas the patient performed these at a higher level of independence PTA. Pt not appropriate to attempt AMB at this time due to weakness and lethargy. Pt will benefit from skilled PT intervention to increase independence and safety with basic mobility in preparation for discharge to the venue listed below.       Follow Up Recommendations Supervision for mobility/OOB;Supervision - Intermittent;CIR    Equipment Recommendations  Rolling walker with 5" wheels    Recommendations for Other Services       Precautions / Restrictions Precautions Precautions: Fall Restrictions Weight  Bearing Restrictions: No      Mobility  Bed Mobility Overal bed mobility: Needs Assistance       Supine to sit: Supervision     General bed mobility comments: appears to come to close to EOB without any falls anxiety, reports to be stable seated feet unsupported.  Transfers Overall transfer level: Needs assistance Equipment used: 1 person hand held assist Transfers: Stand Pivot Transfers;Sit to/from Stand Sit to Stand: Supervision Stand pivot transfers: Min assist          Ambulation/Gait Ambulation/Gait assistance: (deferred until less delayed)              Stairs            Wheelchair Mobility    Modified Rankin (Stroke Patients Only)       Balance Overall balance assessment: Needs assistance Sitting-balance support: Feet unsupported;Single extremity supported Sitting balance-Leahy Scale: Good     Standing balance support: During functional activity;Single extremity supported Standing balance-Leahy Scale: Poor                               Pertinent Vitals/Pain Pain Assessment: No/denies pain    Home Living Family/patient expects to be discharged to:: Private residence Living Arrangements: Children(3 sons: 16yo, 18yo, 79yo (in jail)) Available Help at Discharge: Family Type of Home: Mobile home Home Access: Stairs to enter Entrance Stairs-Rails: Right;Left;Can reach both Entrance Stairs-Number of Steps: 4 Home Layout: One level Home Equipment: None      Prior Function Level of Independence: Independent         Comments: reports no prior difficulty with AMB or balance PTA  Hand Dominance        Extremity/Trunk Assessment   Upper Extremity Assessment Upper Extremity Assessment: Generalized weakness;Overall [facility] for tasks assessed    Lower Extremity Assessment Lower Extremity Assessment: Generalized weakness;Overall [facility] for tasks assessed    Cervical / Trunk Assessment Cervical / Trunk Assessment: Normal   Communication      Cognition Arousal/Alertness: Lethargic   Overall Cognitive Status: Impaired/Different from baseline Area of Impairment: Following commands;Awareness;Attention;Orientation;Problem solving;Memory                 Orientation Level: Person Current Attention Level: Focused Memory: (Tells Thereasa Parkin that her children are 5, 7, and 18, then 1 minute later says they are 17, 18, adn 23.) Following Commands: Follows one step commands consistently     Problem Solving: Slow processing;Decreased initiation        General Comments      Exercises Total Joint Exercises Ankle Circles/Pumps: AROM;Both;5 reps;Supine Straight Leg Raises: AROM;Both;5 reps Long Arc Quad: AROM;Both;5 reps   Assessment/Plan    PT Assessment Patient needs continued PT services  PT Problem List Decreased strength;Decreased range of motion;Decreased activity tolerance;Decreased balance;Decreased mobility;Decreased coordination;Decreased cognition;Decreased knowledge of use of DME;Decreased safety awareness;Decreased knowledge of precautions;Cardiopulmonary status limiting activity       PT Treatment Interventions DME instruction;Balance training;Gait training;Stair training;Functional mobility training;Therapeutic activities;Therapeutic exercise;Patient/family education    PT Goals (Current goals can be found in the Care Plan section)  Acute Rehab PT Goals Patient Stated Goal: regain baseline strength PT Goal Formulation: With patient Time For Goal Achievement: 05/03/20 Potential to Achieve Goals: Good    Frequency Min 2X/week   Barriers to discharge        Co-evaluation               AM-PAC PT "6 Clicks" Mobility  Outcome Measure Help needed turning from your back to your side while in a flat bed without using bedrails?: A Little Help needed moving from lying on your back to sitting on the side of a flat bed without using bedrails?: A Little Help needed moving to and from a  bed to a chair (including a wheelchair)?: A Lot Help needed standing up from a chair using your arms (e.g., wheelchair or bedside chair)?: A Lot Help needed to walk in hospital room?: A Lot Help needed climbing 3-5 steps with a railing? : Total 6 Click Score: 13    End of Session   Activity Tolerance: Patient tolerated treatment well;Patient limited by fatigue Patient left: in chair;with call bell/phone within reach;with nursing/sitter in room Nurse Communication: Mobility status PT Visit Diagnosis: Unsteadiness on feet (R26.81);Other abnormalities of gait and mobility (R26.89);Muscle weakness (generalized) (M62.81);Difficulty in walking, not elsewhere classified (R26.2)    Time: 1610-9604 PT Time Calculation (min) (ACUTE ONLY): 26 min   Charges:   PT Evaluation $PT Eval Moderate Complexity: 1 Mod PT Treatments $Therapeutic Exercise: 8-22 mins        1:23 PM, 04/19/20 Rosamaria Lints, PT, DPT Physical Therapist - [facility]  [phone] (ASCOM)    Hildegard Hlavac C 04/19/2020, 1:19 PM

## 2020-04-19 NOTE — Progress Notes (Addendum)
Inpatient Diabetes Program Recommendations  AACE/ADA: New Consensus Statement on Inpatient Glycemic Control   Target Ranges:  Prepandial:   less than 140 mg/dL      Peak postprandial:   less than 180 mg/dL (1-2 hours)      Critically ill patients:  140 - 180 mg/dL   Results for Jessica Santiago, Jessica Santiago (MRN [id number]) as of 04/19/2020 10:49  Ref. Range 04/18/2020 07:30 04/18/2020 11:20 04/18/2020 16:44 04/18/2020 19:30 04/18/2020 22:06 04/19/2020 00:17 04/19/2020 03:55 04/19/2020 07:16 04/19/2020 08:42  Glucose-Capillary Latest Ref Range: 70 - 99 mg/dL 91 144 (H)  Novolog 3 units  Solumedrol 40 mg 249 (H)  Novolog 7 units  Levemir 34 units 211 (H)  Novolog 7 units 160 (H) 151 (H)  Novolog 4 units 103 (H) 76 89  Results for Jessica Santiago, Jessica Santiago (MRN [id number]) as of 04/19/2020 10:49  Ref. Range 04/14/2020 03:36  Hemoglobin A1C Latest Ref Range: 4.8 - 5.6 % 10.7 (H)   Review of Glycemic Control  Diabetes history: DM Outpatient Diabetes medications: Lantus 10 units QHS, Novolog 10 units TID with meals Current orders for Inpatient glycemic control: Levemir 34 units QHS, Novolog 0-20 units Q4H  Inpatient Diabetes Program Recommendations:   Insulin - Basal: Patient received Levemir 34 units on 04/18/20 and glucose 76 mg/dl this morning. Please consider decreasing Levemir to 20 units (based on 132 kg x 0.15 units).  HbgA1C:  A1C 10.7% on 04/14/20 indicating an average glucose of 260 mg/dl over the past 2-3 months.  NOTE: Spoke with patient about diabetes and home regimen for diabetes control. Patient sitting up in chair talking with family by video chat.  Patient is alert and oriented and able to answer general questions appropriately. Patient reports that she has Medicaid insurance and she is able to get all needed medications.  Patient reports her prior PCP (Dr. Edsel Petrin) was helping her manage her DM and Dr. Edsel Petrin has left area and she is suppose to start seeing a new PCP (Dr. Marily Memos) but she missed her first  scheduled appointment last week and has not rescheduled yet.  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  Patient states that she is taking Lantus 10 units QHS and Novolog 10 units TID with meals as an outpatient for diabetes control. Patient reports that she does not currently have a glucometer so she has not been checking glucose at home.  Inquired about prior A1C and patient reports her last A1C was in the 12% range.  Discussed A1C results (10.7% on 04/14/20) and explained that current A1C indicates an average glucose of 260 mg/dl over the past 2-3 months. Discussed glucose and A1C goals. Discussed importance of checking CBGs and maintaining good CBG control to prevent long-term and short-term complications. Stressed to the patient the importance of improving glycemic control to prevent further complications from uncontrolled diabetes. Informed patient it would be requested that she be provided with a prescription for a new glucose monitoring kit (#[id number]).  Asked patient to call new PCP and reschedule appointment and get a new glucometer and testing supplies so she can start monitoring glucose. Encouraged patient to check glucose 3-4 times per day and to keep a log book of glucose readings and DM medication taken which patient will need to take to doctor appointments. Explained how the doctor can use the log book to continue to make adjustments with DM medications if needed.  Patient states that she uses insulin pens and she has insulin at  home. Patient verbalized understanding of information discussed and reports no further questions at this time related to diabetes.   Thanks, Barnie Alderman, RN, MSN, CDE Diabetes Coordinator Inpatient Diabetes Program [phone] (Team Pager from 8am to 5pm)

## 2020-04-19 NOTE — Progress Notes (Signed)
PHARMACY CONSULT NOTE  Pharmacy Consult for Electrolyte Monitoring and Replacement   Recent Labs: Potassium (mmol/L)  Date Value  04/19/2020 3.7   Magnesium (mg/dL)  Date Value  55/21/7471 2.3   Calcium (mg/dL)  Date Value  59/53/9672 7.9 (L)   Albumin (g/dL)  Date Value  89/79/1504 2.0 (L)   Phosphorus (mg/dL)  Date Value  13/64/3837 2.5   Sodium (mmol/L)  Date Value  04/19/2020 145   Corrected Ca: 9.5 mg/dL  Assessment: 49 year old female admitted with DKA (now resolved), and also with respiratory failure with concern for meningitis/encephalitis requiring intubation.  Patient extubated 04/17/20.  Pharmacy to manage electrolytes.  Goal of Therapy:  electrolytes WNL  Plan:   No electrolytes replacement required    Electrolytes with AM labs  Lowella Bandy ,PharmD 04/19/2020 1:41 PM

## 2020-04-19 NOTE — Progress Notes (Signed)
Physical Therapy Treatment Patient Details Name: Jessica Santiago MRN: [id number] DOB: 03-06-1971 Today's Date: 04/19/2020    History of Present Illness Jessica Santiago is a 49yoF who comes to [facility] on 5/13 c AMS. Pt found by boyfriend at home, was on floor and soiled of urine, feces, and emesis. Pt intubated upon arrival due to acute respiratory failure, admitted with metabolic encephalopathy. PMH: IDDM-1. ICU notes show meningitis on 5/16, successful extubation on 5/17, continued AMS, lethargy.    PT Comments    RN contacted Thereasa Parkin regarding level of assistance required for transfers. Author arrived to perform trial chair to bed with RW. Pt previously in ICU this morning when evaluated, be height inappropriate for transfer back to bed. RN educated on best means to assist pt with transfers, encouraged mostly stand-by assist with intermittent tactile cues to minA for help correcting poster sway and backward LOB. Pt does well following cues, requires heavy effort to rise, but is able to do so safely. Pt does well moving back to bed. RN in room at exit.     Follow Up Recommendations  Supervision for mobility/OOB;Supervision - Intermittent;CIR     Equipment Recommendations  Rolling walker with 5" wheels    Recommendations for Other Services       Precautions / Restrictions Precautions Precautions: Fall Restrictions Weight Bearing Restrictions: No    Mobility  Bed Mobility Overal bed mobility: Needs Assistance Bed Mobility: Sit to Supine     Supine to sit: Supervision Sit to supine: Min assist   General bed mobility comments: minA of legs, pt controls trunk well, able to reposition pelvis in bed upon cue  Transfers Overall transfer level: Needs assistance Equipment used: Rolling walker (2 wheeled) Transfers: Sit to/from [employer] Sit to Stand: Supervision Stand pivot transfers: Min guard       General transfer comment: cues for step-by-step safety, pt puts  forth strong effort, a few instances of posterior sway made better by RW moved more forward and away from COM.  Ambulation/Gait Ambulation/Gait assistance: (deferred at this time)               Social research officer, government Rankin (Stroke Patients Only)       Balance Overall balance assessment: Needs assistance Sitting-balance support: Feet unsupported;Single extremity supported Sitting balance-Leahy Scale: Good     Standing balance support: During functional activity;Single extremity supported Standing balance-Leahy Scale: Poor                              Cognition Arousal/Alertness: Lethargic Behavior During Therapy: WFL for tasks assessed/performed;Impulsive Overall Cognitive Status: Impaired/Different from baseline Area of Impairment: Following commands;Awareness;Attention;Orientation;Problem solving;Memory                 Orientation Level: Person Current Attention Level: Focused Memory: (Tells Thereasa Parkin that her children are 5, 7, and 18, then 1 minute later says they are 17, 18, adn 23.) Following Commands: Follows one step commands consistently     Problem Solving: Slow processing;Decreased initiation        Exercises Total Joint Exercises Ankle Circles/Pumps: AROM;Both;5 reps;Supine Straight Leg Raises: AROM;Both;5 reps Long Arc Quad: AROM;Both;5 reps    General Comments        Pertinent Vitals/Pain Pain Assessment: No/denies pain    Home Living Family/patient expects to be discharged to:: Private residence Living Arrangements: Children(3 sons: 17yo,  18yo, 23yo (in jail)) Available Help at Discharge: Family Type of Home: Mobile home Home Access: Stairs to enter Entrance Stairs-Rails: Right;Left;Can reach both Home Layout: One level Home Equipment: None      Prior Function Level of Independence: Independent      Comments: reports no prior difficulty with AMB or balance PTA   PT Goals  (current goals can now be found in the care plan section) Acute Rehab PT Goals Patient Stated Goal: regain baseline strength PT Goal Formulation: With patient Time For Goal Achievement: 05/03/20 Potential to Achieve Goals: Good Progress towards PT goals: Progressing toward goals    Frequency    Min 2X/week      PT Plan Current plan remains appropriate    Co-evaluation              AM-PAC PT "6 Clicks" Mobility   Outcome Measure  Help needed turning from your back to your side while in a flat bed without using bedrails?: A Little Help needed moving from lying on your back to sitting on the side of a flat bed without using bedrails?: A Little Help needed moving to and from a bed to a chair (including a wheelchair)?: A Little Help needed standing up from a chair using your arms (e.g., wheelchair or bedside chair)?: A Little Help needed to walk in hospital room?: A Lot Help needed climbing 3-5 steps with a railing? : Total 6 Click Score: 15    End of Session Equipment Utilized During Treatment: Gait belt Activity Tolerance: Patient tolerated treatment well;Patient limited by fatigue Patient left: with call bell/phone within reach;with nursing/sitter in room;in bed Nurse Communication: Mobility status PT Visit Diagnosis: Unsteadiness on feet (R26.81);Other abnormalities of gait and mobility (R26.89);Muscle weakness (generalized) (M62.81);Difficulty in walking, not elsewhere classified (R26.2)     Time: 1450-1500 PT Time Calculation (min) (ACUTE ONLY): 10 min  Charges:  $Therapeutic Exercise: 8-22 mins                     3:08 PM, 04/19/20 Etta Grandchild, PT, DPT Physical Therapist - [facility]  [phone] ([place])     [place] C 04/19/2020, 3:05 PM

## 2020-04-19 NOTE — Progress Notes (Signed)
Pt remained stable throughout shift. Vital signs WNL. PT came and got pt up to chair. Pt started on full liquid diet and tolerated well. Report called to [place] on 2C. Pt transferred to [facility].

## 2020-04-20 LAB — GLUCOSE, CAPILLARY
Glucose-Capillary: 120 mg/dL — ABNORMAL HIGH (ref 70–99)
Glucose-Capillary: 139 mg/dL — ABNORMAL HIGH (ref 70–99)
Glucose-Capillary: 192 mg/dL — ABNORMAL HIGH (ref 70–99)
Glucose-Capillary: 236 mg/dL — ABNORMAL HIGH (ref 70–99)
Glucose-Capillary: 272 mg/dL — ABNORMAL HIGH (ref 70–99)
Glucose-Capillary: 64 mg/dL — ABNORMAL LOW (ref 70–99)

## 2020-04-20 LAB — CBC WITH DIFFERENTIAL/PLATELET
Abs Immature Granulocytes: 0.08 10*3/uL — ABNORMAL HIGH (ref 0.00–0.07)
Basophils Absolute: 0.1 10*3/uL (ref 0.0–0.1)
Basophils Relative: 1 %
Eosinophils Absolute: 0.9 10*3/uL — ABNORMAL HIGH (ref 0.0–0.5)
Eosinophils Relative: 9 %
HCT: 31.5 % — ABNORMAL LOW (ref 36.0–46.0)
Hemoglobin: 10.2 g/dL — ABNORMAL LOW (ref 12.0–15.0)
Immature Granulocytes: 1 %
Lymphocytes Relative: 28 %
Lymphs Abs: 2.7 10*3/uL (ref 0.7–4.0)
MCH: 30.7 pg (ref 26.0–34.0)
MCHC: 32.4 g/dL (ref 30.0–36.0)
MCV: 94.9 fL (ref 80.0–100.0)
Monocytes Absolute: 1.2 10*3/uL — ABNORMAL HIGH (ref 0.1–1.0)
Monocytes Relative: 13 %
Neutro Abs: 4.6 10*3/uL (ref 1.7–7.7)
Neutrophils Relative %: 48 %
Platelets: 261 10*3/uL (ref 150–400)
RBC: 3.32 MIL/uL — ABNORMAL LOW (ref 3.87–5.11)
RDW: 13.3 % (ref 11.5–15.5)
WBC: 9.4 10*3/uL (ref 4.0–10.5)
nRBC: 0 % (ref 0.0–0.2)

## 2020-04-20 LAB — BASIC METABOLIC PANEL
Anion gap: 5 (ref 5–15)
BUN: 23 mg/dL — ABNORMAL HIGH (ref 6–20)
CO2: 25 mmol/L (ref 22–32)
Calcium: 7.8 mg/dL — ABNORMAL LOW (ref 8.9–10.3)
Chloride: 114 mmol/L — ABNORMAL HIGH (ref 98–111)
Creatinine, Ser: 0.93 mg/dL (ref 0.44–1.00)
GFR calc Af Amer: >60 mL/min (ref >60)
GFR calc non Af Amer: >60 mL/min (ref >60)
Glucose, Bld: 110 mg/dL — ABNORMAL HIGH (ref 70–99)
Potassium: 3.6 mmol/L (ref 3.5–5.1)
Sodium: 144 mmol/L (ref 135–145)

## 2020-04-20 LAB — ANAEROBIC CULTURE

## 2020-04-20 LAB — MAGNESIUM: Magnesium: 2.1 mg/dL (ref 1.7–2.4)

## 2020-04-20 LAB — PHOSPHORUS: Phosphorus: 3 mg/dL (ref 2.5–4.6)

## 2020-04-20 MED ORDER — ADULT MULTIVITAMIN W/MINERALS CH
1.0000 | ORAL_TABLET | Freq: Every day | ORAL | Status: DC
  Administered 2020-04-20 – 2020-04-21 (×2): 1 via ORAL
  Filled 2020-04-20 (×2): qty 1

## 2020-04-20 MED ORDER — DOCUSATE SODIUM 100 MG PO CAPS
100.0000 mg | ORAL_CAPSULE | Freq: Two times a day (BID) | ORAL | Status: DC
  Administered 2020-04-20 – 2020-04-21 (×3): 100 mg via ORAL
  Filled 2020-04-20 (×3): qty 1

## 2020-04-20 MED ORDER — INSULIN ASPART 100 UNIT/ML ~~LOC~~ SOLN
3.0000 [IU] | Freq: Three times a day (TID) | SUBCUTANEOUS | Status: DC
  Administered 2020-04-20 – 2020-04-21 (×3): 3 [IU] via SUBCUTANEOUS
  Filled 2020-04-20 (×3): qty 1

## 2020-04-20 MED ORDER — AMLODIPINE BESYLATE 5 MG PO TABS
5.0000 mg | ORAL_TABLET | Freq: Every day | ORAL | Status: DC
  Administered 2020-04-20: 5 mg via ORAL
  Filled 2020-04-20: qty 1

## 2020-04-20 MED ORDER — INSULIN ASPART 100 UNIT/ML ~~LOC~~ SOLN
0.0000 [IU] | Freq: Three times a day (TID) | SUBCUTANEOUS | Status: DC
  Administered 2020-04-20: 5 [IU] via SUBCUTANEOUS
  Administered 2020-04-21: 2 [IU] via SUBCUTANEOUS
  Administered 2020-04-21: 1 [IU] via SUBCUTANEOUS
  Filled 2020-04-20 (×3): qty 1

## 2020-04-20 MED ORDER — FAMOTIDINE 20 MG PO TABS
20.0000 mg | ORAL_TABLET | Freq: Two times a day (BID) | ORAL | Status: DC
  Administered 2020-04-20 – 2020-04-21 (×3): 20 mg via ORAL
  Filled 2020-04-20 (×3): qty 1

## 2020-04-20 MED ORDER — ENSURE MAX PROTEIN PO LIQD
11.0000 [oz_av] | Freq: Two times a day (BID) | ORAL | Status: DC
  Administered 2020-04-20 – 2020-04-21 (×3): 11 [oz_av] via ORAL
  Filled 2020-04-20: qty 330

## 2020-04-20 MED ORDER — INSULIN ASPART 100 UNIT/ML ~~LOC~~ SOLN
0.0000 [IU] | Freq: Every day | SUBCUTANEOUS | Status: DC
  Administered 2020-04-20: 3 [IU] via SUBCUTANEOUS
  Filled 2020-04-20: qty 1

## 2020-04-20 NOTE — Progress Notes (Signed)
Inpatient Diabetes Program Recommendations  AACE/ADA: New Consensus Statement on Inpatient Glycemic Control   Target Ranges:  Prepandial:   less than 140 mg/dL      Peak postprandial:   less than 180 mg/dL (1-2 hours)      Critically ill patients:  140 - 180 mg/dL   Results for KAILE, BIXLER (MRN [id number]) as of 04/20/2020 08:38  Ref. Range 04/19/2020 08:42 04/19/2020 11:16 04/19/2020 12:50 04/19/2020 17:05 04/19/2020 19:55 04/20/2020 00:44 04/20/2020 03:26 04/20/2020 08:29  Glucose-Capillary Latest Ref Range: 70 - 99 mg/dL 89 69 (L) 366 (H) 815 (H)  Novolog 3 units 201 (H)  Novolog 4 units  Levemir 20 units 236 (H)  Novolog 7 units 139 (H)  Novolog 3 units 64 (L)   Review of Glycemic Control  Diabetes history: DM Outpatient Diabetes medications: Lantus 10 units QHS, Novolog 10 units TID with meals Current orders for Inpatient glycemic control: Levemir 20 units QHS, Novolog 0-20 units Q4H  Inpatient Diabetes Program Recommendations:   Insulin-Correction: Please consider decreasing Novolog to 0-9 units TID with meals and Novolog 0-5 units QHS.  Insulin-Meal Coverage: Please consider ordering Novolog 3 units TID with meals for meal coverage if patient eats at least 50% of meals.  Thanks, Orlando Penner, RN, MSN, CDE Diabetes Coordinator Inpatient Diabetes Program [phone] (Team Pager from 8am to 5pm)

## 2020-04-20 NOTE — NC FL2 (Addendum)
[place] MEDICAID FL2 LEVEL OF CARE SCREENING TOOL     IDENTIFICATION  Patient Name: Jessica Santiago Birthdate: 1971/11/19 Sex: female Admission Date (Current Location): 04/13/2020  [place] and [place] Number:  Chiropodist and Address:  [facility], [address], [place], [place] [address]      Provider Number: [id number]  Attending Physician Name and Address:  Pennie Banter, DO  Relative Name and Phone Number:       Current Level of Care: Hospital Recommended Level of Care: Skilled Nursing Facility Prior Approval Number:    Date Approved/Denied:   PASRR Number:   [id number]  Discharge Plan: SNF    Current Diagnoses: Patient Active Problem List   Diagnosis Date Noted  . Acute respiratory failure with hypoxia (HCC) 04/13/2020    Orientation RESPIRATION BLADDER Height & Weight     Self, Time, Situation, Place  Normal, Other (Comment)(CPAP at night) Not on cpap at home. Incontinent, External catheter Weight: 296 lb 11.8 oz (134.6 kg) Height:  5' 7.01" (170.2 cm)  BEHAVIORAL SYMPTOMS/MOOD NEUROLOGICAL BOWEL NUTRITION STATUS  (None) (None) Incontinent Diet(Full liquid)  AMBULATORY STATUS COMMUNICATION OF NEEDS Skin   Extensive Assist Verbally Bruising                       Personal Care Assistance Level of Assistance  Bathing, Feeding, Dressing Bathing Assistance: Maximum assistance Feeding assistance: Limited assistance Dressing Assistance: Maximum assistance     Functional Limitations Info  Sight, Hearing, Speech Sight Info: Adequate Hearing Info: Adequate Speech Info: Adequate    SPECIAL CARE FACTORS FREQUENCY  PT (By licensed PT), OT (By licensed OT)     PT Frequency: 5 x week OT Frequency: 5 x week            Contractures Contractures Info: Not present    Additional Factors Info  Code Status, Allergies Code Status Info: Full code Allergies Info: Not on file           Current Medications  (04/20/2020):  This is the current hospital active medication list Current Facility-Administered Medications  Medication Dose Route Frequency Provider Last Rate Last Admin  . 0.9 %  sodium chloride infusion  250 mL Intravenous PRN Erin Fulling, MD   Stopped at 04/19/20 2330  . 0.9 %  sodium chloride infusion  250 mL Intravenous Continuous Judithe Modest, NP   Stopped at 04/19/20 1440  . acetaminophen (TYLENOL) tablet 650 mg  650 mg Per Tube Q4H PRN Erin Fulling, MD   650 mg at 04/19/20 2255  . albumin human 25 % solution 12.5 g  12.5 g Intravenous Daily Vida Rigger, MD   Stopped at 04/19/20 1211  . amLODipine (NORVASC) tablet 5 mg  5 mg Oral Daily Hallaji, Mardene Speak, RPH      . chlorhexidine (PERIDEX) 0.12 % solution 15 mL  15 mL Mouth Rinse BID Vida Rigger, MD   15 mL at 04/19/20 2039  . dextrose 50 % solution 0-50 mL  0-50 mL Intravenous PRN Sharman Cheek, MD   25 mL at 04/19/20 1123  . diphenhydrAMINE (BENADRYL) injection 50 mg  50 mg Intravenous Q8H PRN Vida Rigger, MD      . docusate sodium (COLACE) capsule 100 mg  100 mg Oral BID Hallaji, Sheema M, RPH      . enoxaparin (LOVENOX) injection 40 mg  40 mg Subcutaneous Q12H Erin Fulling, MD   40 mg at 04/19/20 2126  . famotidine (PEPCID)  tablet 20 mg  20 mg Oral BID Pernell Dupre, RPH      . fentaNYL (SUBLIMAZE) bolus via infusion 50 mcg  50 mcg Intravenous Q15 min PRN Flora Lipps, MD   50 mcg at 04/17/20 0622  . hydrALAZINE (APRESOLINE) tablet 25 mg  25 mg Oral Q6H PRN Awilda Bill, NP   25 mg at 04/16/20 0053  . insulin aspart (novoLOG) injection 0-20 Units  0-20 Units Subcutaneous Q4H Awilda Bill, NP   3 Units at 04/20/20 2248  . insulin detemir (LEVEMIR) injection 20 Units  20 Units Subcutaneous QHS Dallie Piles, RPH   20 Units at 04/19/20 2248  . LORazepam (ATIVAN) injection 2-4 mg  2-4 mg Intravenous Q1H PRN Flora Lipps, MD   2 mg at 04/18/20 0208  . LORazepam (ATIVAN) injection 4 mg  4 mg Intravenous  Once Flora Lipps, MD   Stopped at 04/13/20 2034  . multivitamin with minerals tablet 1 tablet  1 tablet Oral Daily Hallaji, Sheema M, RPH      . ondansetron (ZOFRAN) injection 4 mg  4 mg Intravenous Q6H PRN Flora Lipps, MD      . pneumococcal 23 valent vaccine (PNEUMOVAX-23) injection 0.5 mL  0.5 mL Intramuscular Tomorrow-1000 Kasa, Kurian, MD      . polyethylene glycol (MIRALAX / GLYCOLAX) packet 17 g  17 g Oral Daily PRN Flora Lipps, MD      . sodium chloride flush (NS) 0.9 % injection 10-40 mL  10-40 mL Intracatheter Q12H Flora Lipps, MD   10 mL at 04/19/20 2133  . sodium chloride flush (NS) 0.9 % injection 10-40 mL  10-40 mL Intracatheter PRN Flora Lipps, MD      . sodium chloride flush (NS) 0.9 % injection 3 mL  3 mL Intravenous Q12H Flora Lipps, MD   3 mL at 04/19/20 2132  . sodium chloride flush (NS) 0.9 % injection 3 mL  3 mL Intravenous PRN Flora Lipps, MD         Discharge Medications: Please see discharge summary for a list of discharge medications.  Relevant Imaging Results:  Relevant Lab Results:   Additional Information SS#: [id number]  Candie Chroman, LCSW

## 2020-04-20 NOTE — Progress Notes (Signed)
Inpatient Rehab Admissions:  Inpatient Rehab Consult received.  I spoke with pt via phone to discuss recommended rehab program and review details and expectations of CIR. Feel that pt has the ability to return to Mod I level through CIR and would be an appropriate candidate from what I have seen in her therapy notes. However, pt reports she would prefer to return home with her sons (ages 74 and 81) and she feels they are able to provide the assist she would need at DC. Will discuss case with [facility] team as its unclear if pt is a reliable historian at this time.  Cheri Rous, OTR/L  Rehab Admissions Coordinator  [phone] 04/20/2020 4:55 PM

## 2020-04-20 NOTE — Progress Notes (Addendum)
   04/20/20 1142  Clinical Encounter Type  Visited With Patient  Visit Type Initial  Referral From Chaplain  Consult/Referral To Chaplain  Chaplain checked in on patient and asked how she is feeling. Patient said that she is feeling better. Chaplain made patient aware of chaplains availability.

## 2020-04-20 NOTE — TOC Initial Note (Addendum)
Transition of Care [facility]) - Initial/Assessment Note    Patient Details  Name: Jessica Santiago MRN: [id number] Date of Birth: Jun 08, 1971  [facility]) CM/SW Contact:    Candie Chroman, LCSW Phone Number: 04/20/2020, 9:38 AM  Clinical Narrative: CSW met with patient. No supports at bedside. CSW introduced role and explained that PT recommendations would be discussed. Patient agreeable to CIR. Discussed potential barriers to admission there and introduced potential plan for SNF backup. Made her aware that with Medicaid (confirmed with Med Assist yesterday that she has full Medicaid), she has to stay at SNF a minimum of 30 days for Medicaid to pay, her check minus about $30 will go directly to the facility, and we may not have many options due to Medicaid bed availability and because Medicaid does not cover therapy so the SNF has to absorb those costs. Patient is agreeable. Patient reports she lives in [place] with her three sons. She was not using any equipment to get around at home prior to admission. Patient says her PCP is Dr. Hampton Abbot (maybe Philis Fendt, PA-C?) at [facility] in [place].              Expected Discharge Plan: IP Rehab Facility Barriers to Discharge: Continued Medical Work up   Patient Goals and CMS Choice   CMS Medicare.gov Compare Post Acute Care list provided to:: Patient    Expected Discharge Plan and Services Expected Discharge Plan: [place]     Post Acute Care Choice: IP Rehab Living arrangements for the past 2 months: Mobile Home                                      Prior Living Arrangements/Services Living arrangements for the past 2 months: Mobile Home Lives with:: Adult Children, Minor Children Patient language and need for interpreter reviewed:: Yes Do you feel safe going back to the place where you live?: Yes      Need for Family Participation in Patient Care: Yes (Comment) Care giver support system in  place?: Yes (comment)   Criminal Activity/Legal Involvement Pertinent to Current Situation/Hospitalization: No - Comment as needed  Activities of Daily Living Home Assistive Devices/Equipment: None ADL Screening (condition at time of admission) Patient's cognitive ability adequate to safely complete daily activities?: Yes Is the patient deaf or have difficulty hearing?: No Does the patient have difficulty seeing, even when wearing glasses/contacts?: No Does the patient have difficulty concentrating, remembering, or making decisions?: No Patient able to express need for assistance with ADLs?: Yes Does the patient have difficulty dressing or bathing?: No Independently performs ADLs?: No Communication: Independent Dressing (OT): Needs assistance(due to weakness) Is this a change from baseline?: Change from baseline, expected to last <3days Feeding: Independent Bathing: Needs assistance(due to weakness) Is this a change from baseline?: Change from baseline, expected to last <3 days Toileting: Needs assistance Is this a change from baseline?: Change from baseline, expected to last <3 days In/Out Bed: Needs assistance(due to weakness) Is this a change from baseline?: Change from baseline, expected to last <3 days Walks in Home: Independent Does the patient have difficulty walking or climbing stairs?: Yes Weakness of Legs: Both Weakness of Arms/Hands: None  Permission Sought/Granted Permission sought to share information with : Facility Art therapist granted to share information with : Yes, Verbal Permission Granted     Permission granted to share info  w AGENCY: SNF's        Emotional Assessment Appearance:: Appears stated age Attitude/Demeanor/Rapport: Engaged, Gracious Affect (typically observed): Accepting, Appropriate, Calm, Pleasant Orientation: : Oriented to Self, Oriented to Place, Oriented to  Time, Oriented to Situation Alcohol / Substance Use: Not  Applicable Psych Involvement: No (comment)  Admission diagnosis:  Morbid obesity ([place]) [E66.01] Acute respiratory failure with hypoxia (HCC) [J96.01] Severe sepsis (HCC) [A41.9, R65.20] Diabetic ketoacidosis with coma associated with type 2 diabetes mellitus ([place]) [E11.11] Patient Active Problem List   Diagnosis Date Noted  . Acute respiratory failure with hypoxia ([place]) 04/13/2020   PCP:  No primary care provider on file. Pharmacy:   [facility] [place], [place] [address] [place]  [id number] Phone: [phone] Fax: [phone]     Social Determinants of Health (SDOH) Interventions    Readmission Risk Interventions No flowsheet data found.

## 2020-04-20 NOTE — Progress Notes (Signed)
PROGRESS NOTE    Jessica Santiago   QPY:[id number]  DOB: 1971-06-22  PCP: No primary care provider on file.    DOA: 04/13/2020 LOS: 7   Brief Narrative   49 y.o. female with a history of diabetes, hyperlipidemia, migraines, depression and morbid obesity who was brought to the ED on 04/13/20 due to altered mental status after being found on the floor at home covered in feces urine and vomit by her boyfriend.  EMS was called, they reported patient was confused, agitated, unable to provide history or engage in medical care.  Blood sugar on scene read as "high."  In the ED, patient was found to be in DKA, unable to maintain patent airway due to encephalopathy.  She was admitted to ICU critically ill with multiorgan failure, intubated and required vasopressors.   5/13 intubated, sedated, DKA admitted for resp failure 5/14 CVL placed, on pressors, weaned off Insulin drip 5/15 failed weaning trials due to severe encephalopathy, started on precedex 5/17 - extubated, on nasal canula O2 5/18 - no overnight events, continues to have slow mentation with lethargy 5/19 - downgraded to med/surg 5/20 - care assumed by hospitalist service     Assessment & Plan   Active Problems:   Acute respiratory failure with hypoxia ([place])   Severe ACUTE Hypoxic and Hypercapnic Respiratory Failuredue to Severe Diabetic Ketoacidosis with Toxic Metabolic Encephalopathycomplicated by fevers etiology probable c/w CAP Sepsis secondary to Community-acquired Pneumonia - present on admission. --Meningitis/encephalitis ruled out - CSF studies showed atypical profile with lymphocytic predominant leukocytosis, cultures negative.  Patient was treated empirically in ICU initially.  --Antibiotics de-escalated and patient continues to improve clinically. --DKA resolved with Endotool and insulin drip. --Respiratory failure required intubation and mechanical ventilation.  Extubated 5/17. PLAN: --continue supplemental O2 as  needed to maintain O2 sat >= 90%  --continue Rocephin  --follow up respiratory culture   Acute Kidney Injury - POA, likely due to sepsis and hypotension.  Resolved.  Foley d/c'd.   --monitor renal function  Type 2 Diabetes - presented in DKA.  Uncontrolled.  A1c is 10.7%.  Home regimen is Lantus 10 units QHS, Novolog 10 units TID with meals.   --Levemir 20 units qHS --Novolog 3 units TID WC and sliding scale   Morbid obesity,  (?)Obstructive Sleep Apnea  --impacts respiratory mechanics, overall care and prognosis. --recommend sleep study as outpatient     Morbid obesity: Body mass index is 46.46 kg/m.  Complicates overall care and prognosis  DVT prophylaxis: Lovenox  Diet:  Diet Orders (From admission, onward)    Start     Ordered   04/19/20 1224  Diet full liquid Room service appropriate? Yes; Fluid consistency: Thin  Diet effective now    Question Answer Comment  Room service appropriate? Yes   Fluid consistency: Thin      04/19/20 1224            Code Status: Full Code    Subjective 04/20/20    Patient seen at bedside.  No acute events reported.  She reports feeling okay.  No chest pain or SOB, fevers or chills, headaches or other acute complaints.  She says she hopes to go home instead of rehab.  Says she lives with her 2 sons.   Disposition Plan & Communication   Status is: Inpatient  Remains inpatient appropriate because:Unsafe d/c plan.  Patient is being evaluated for CIR placement.  She is profoundly weak and unable to safely discharge until placement is obtained.  Dispo:  The patient is from: Home              Anticipated d/c is to: CIR              Anticipated d/c date is: 1 day              Patient currently is medically stable for discharge.    Family Communication: none at bedside, will attempt to call.    Consults, Procedures, Significant Events   Consultants:   Neurology  PM&R / rehab  See above for outline of significant  events.   Objective   Vitals:   04/20/20 0453 04/20/20 0457 04/20/20 1219 04/20/20 1222  BP: (!) 148/65  (!) 183/83   Pulse: 84  (!) 103 (!) 102  Resp: 16  20   Temp: 99 F (37.2 C)   (!) 97.3 F (36.3 C)  TempSrc:      SpO2: 100%  98% 99%  Weight:  134.6 kg    Height:        Intake/Output Summary (Last 24 hours) at 04/20/2020 1329 Last data filed at 04/20/2020 0900 Gross per 24 hour  Intake 842.5 ml  Output 1200 ml  Net -357.5 ml   Filed Weights   04/18/20 0500 04/19/20 0447 04/20/20 0457  Weight: 132.3 kg 132.4 kg 134.6 kg    Physical Exam:  General exam: awake, alert, no acute distress, morbidly obese HEENT: moist mucus membranes, hearing grossly normal  Respiratory system: decreased breath sounds but clear bilaterally, no wheezes, rales or rhonchi, normal respiratory effort. Cardiovascular system: normal S1/S2, 2/6 systolic murmur, nonpitting lower extremity edema.   Gastrointestinal system: soft, NT, ND, +bowel sounds. Central nervous system: A&O x3. no gross focal neurologic deficits, normal speech Extremities: moves all, no edema, normal tone Skin: dry, intact, normal temperature, normal color Psychiatry: normal mood, congruent affect, judgement and insight appear normal  Labs   Data Reviewed: I have personally reviewed following labs and imaging studies  CBC: Recent Labs  Lab 04/16/20 0458 04/17/20 0602 04/18/20 0425 04/19/20 0440 04/20/20 0629  WBC 10.0 23.0* 14.0* 11.7* 9.4  NEUTROABS 8.0* 14.9* 8.3* 7.1 4.6  HGB 13.1 15.5* 11.4* 10.7* 10.2*  HCT 40.2 46.7* 35.1* 33.6* 31.5*  MCV 93.3 94.5 96.2 96.3 94.9  PLT 291 411* 223 242 261   Basic Metabolic Panel: Recent Labs  Lab 04/16/20 1738 04/17/20 0602 04/18/20 0425 04/19/20 0440 04/20/20 0629  NA 140 141 142 145 144  K 3.6 3.4* 3.5 3.7 3.6  CL 112* 110 112* 113* 114*  CO2 21* 22 23 26 25   GLUCOSE 170* 151* 165* 111* 110*  BUN 43* 46* 45* 35* 23*  CREATININE 0.90 0.91 1.01* 0.86 0.93   CALCIUM 7.5* 8.1* 7.7* 7.9* 7.8*  MG 2.3 2.2 2.3 2.3 2.1  PHOS 3.2 3.2 2.6 2.5 3.0   GFR: Estimated Creatinine Clearance: 106 mL/min (by C-G formula based on SCr of 0.93 mg/dL). Liver Function Tests: Recent Labs  Lab 04/18/20 0425 04/19/20 0440  ALBUMIN 1.8* 2.0*   No results for input(s): LIPASE, AMYLASE in the last 168 hours. No results for input(s): AMMONIA in the last 168 hours. Coagulation Profile: Recent Labs  Lab 04/14/20 0901  INR 1.1   Cardiac Enzymes: No results for input(s): CKTOTAL, CKMB, CKMBINDEX, TROPONINI in the last 168 hours. BNP (last 3 results) No results for input(s): PROBNP in the last 8760 hours. HbA1C: No results for input(s): HGBA1C in the last 72 hours. CBG: Recent Labs  Lab  04/20/20 0044 04/20/20 0326 04/20/20 0829 04/20/20 0931 04/20/20 1220  GLUCAP 236* 139* 64* 120* 192*   Lipid Profile: No results for input(s): CHOL, HDL, LDLCALC, TRIG, CHOLHDL, LDLDIRECT in the last 72 hours. Thyroid Function Tests: No results for input(s): TSH, T4TOTAL, FREET4, T3FREE, THYROIDAB in the last 72 hours. Anemia Panel: No results for input(s): VITAMINB12, FOLATE, FERRITIN, TIBC, IRON, RETICCTPCT in the last 72 hours. Sepsis Labs: Recent Labs  Lab 04/14/20 0336 04/15/20 0442  PROCALCITON 0.31 0.17    Recent Results (from the past 240 hour(s))  MRSA PCR Screening     Status: None   Collection Time: 04/13/20  9:14 AM   Specimen: Nasopharyngeal  Result Value Ref Range Status   MRSA by PCR NEGATIVE NEGATIVE Final    Comment:        The GeneXpert MRSA Assay (FDA approved for NASAL specimens only), is one component of a comprehensive MRSA colonization surveillance program. It is not intended to diagnose MRSA infection nor to guide or monitor treatment for MRSA infections. Performed at [facility], [address] [place], [place] [address]   Culture, blood (routine x 2)     Status: None   Collection Time: 04/13/20  9:17 AM    Specimen: BLOOD  Result Value Ref Range Status   Specimen Description BLOOD BLOOD RIGHT HAND  Final   Special Requests   Final    BOTTLES DRAWN AEROBIC AND ANAEROBIC Blood Culture adequate volume   Culture   Final    NO GROWTH 5 DAYS Performed at [facility], [address], [place], [place] [address]    Report Status 04/18/2020 FINAL  Final  Culture, blood (routine x 2)     Status: None   Collection Time: 04/13/20  9:17 AM   Specimen: BLOOD  Result Value Ref Range Status   Specimen Description BLOOD BLOOD LEFT HAND  Final   Special Requests   Final    BOTTLES DRAWN AEROBIC AND ANAEROBIC Blood Culture adequate volume   Culture   Final    NO GROWTH 5 DAYS Performed at [facility], [address] [place], [place] [address]    Report Status 04/18/2020 FINAL  Final  SARS Coronavirus 2 by RT PCR (hospital order, performed in [facility] hospital lab) Nasopharyngeal Nasopharyngeal Swab     Status: None   Collection Time: 04/13/20  9:17 AM   Specimen: Nasopharyngeal Swab  Result Value Ref Range Status   SARS Coronavirus 2 NEGATIVE NEGATIVE Final    Comment: (NOTE) SARS-CoV-2 target nucleic acids are NOT DETECTED. The SARS-CoV-2 RNA is generally detectable in upper and lower respiratory specimens during the acute phase of infection. The lowest concentration of SARS-CoV-2 viral copies this assay can detect is 250 copies / mL. A negative result does not preclude SARS-CoV-2 infection and should not be used as the sole basis for treatment or other patient management decisions.  A negative result may occur with improper specimen collection / handling, submission of specimen other than nasopharyngeal swab, presence of viral mutation(s) within the areas targeted by this assay, and inadequate number of viral copies (<250 copies / mL). A negative result must be combined with clinical observations, patient history, and epidemiological information. Fact Sheet for Patients:    BoilerBrush.com.cy Fact Sheet for Healthcare Providers: https://pope.com/ This test is not yet approved or cleared  by the [place] FDA and has been authorized for detection and/or diagnosis of SARS-CoV-2 by FDA under an Emergency Use Authorization (EUA).  This EUA will remain in effect (meaning this test can be used) for the duration of the COVID-19 declaration under Section 564(b)(1) of the Act, 21 U.S.C. section 360bbb-3(b)(1), unless the authorization is terminated or revoked sooner. Performed at [facility], [address] [place], [place] [address]   Urine culture     Status: None   Collection Time: 04/13/20 10:27 AM   Specimen: Urine, Random  Result Value Ref Range Status   Specimen Description   Final    URINE, RANDOM Performed at [facility], [address], [place], [place] [address]    Special Requests   Final    NONE Performed at [facility], [address], [place], [place] [address]    Culture   Final    NO GROWTH Performed at [facility], [address] [place], [place] [address]    Report Status 04/14/2020 FINAL  Final  CSF culture     Status: None   Collection Time: 04/14/20  2:00 PM   Specimen: CSF; Cerebrospinal Fluid  Result Value Ref Range Status   Specimen Description   Final    CSF Performed at [facility], [address], [place], [place] [address]    Special Requests   Final    Normal Performed at [facility], [address] [place], [place] [address]    Gram Stain WBC SEEN RED BLOOD CELLS NO ORGANISMS SEEN   Final   Culture   Final    NO GROWTH Performed at [facility], [address] [place], [place] [address]    Report Status 04/17/2020 FINAL  Final  Anaerobic culture     Status: None   Collection Time: 04/14/20  4:38 PM   Specimen: PATH Cytology CSF; Cerebrospinal Fluid  Result Value Ref Range Status    Specimen Description   Final    CSF Performed at [facility], [address], [place], [place] [address]    Special Requests   Final    NONE Performed at [facility], [address], [place], [place] [address]    Culture   Final    NO ANAEROBES ISOLATED Performed at [facility], [address] [place], [place] [address]    Report Status 04/20/2020 FINAL  Final  Gram stain     Status: None   Collection Time: 04/14/20  4:38 PM   Specimen: PATH Cytology CSF; Cerebrospinal Fluid  Result Value Ref Range Status   Specimen Description CSF  Final   Special Requests NONE  Final   Gram Stain   Final    NO ORGANISMS SEEN WBC SEEN RED BLOOD CELLS SEEN Performed at [facility], [address] [place], [place] [address]    Report Status 04/15/2020 FINAL  Final  Culture, fungus without smear     Status: None (Preliminary result)   Collection Time: 04/14/20  4:38 PM   Specimen: PATH Cytology CSF; Cerebrospinal Fluid  Result Value Ref Range Status   Specimen Description   Final    CSF Performed at [facility], [address] [place], [place] [address]    Special Requests   Final    NONE Performed at [facility], [address] [place], [place] [address]    Culture   Final    NO FUNGUS ISOLATED AFTER 6 DAYS Performed at [facility], [address], [place], [place] [address]    Report Status PENDING  Incomplete      Imaging Studies   No results found.   Medications   Scheduled Meds: . amLODipine  5 mg Oral Daily  . chlorhexidine  15 mL Mouth Rinse BID  . docusate sodium  100 mg Oral BID  . enoxaparin (LOVENOX) injection  40 mg Subcutaneous Q12H  . famotidine  20 mg Oral BID  . insulin aspart  0-20 Units Subcutaneous Q4H  . insulin detemir  20 Units Subcutaneous QHS  . LORazepam  4 mg Intravenous Once  . multivitamin with minerals  1 tablet Oral Daily  . pneumococcal 23 valent vaccine  0.5 mL  Intramuscular Tomorrow-1000  . Ensure Max Protein  11 oz Oral BID  . sodium chloride flush  10-40 mL Intracatheter Q12H  . sodium chloride flush  3 mL Intravenous Q12H   Continuous Infusions: . sodium chloride 250 mL (04/20/20 0958)  . sodium chloride Stopped (04/19/20 1440)       LOS: 7 days    Time spent: 40-45 minutes with > 50% spent in coordination of care and direct patient contact.    Pennie Banter, DO [facility]  04/20/2020, 1:29 PM    If 7PM-7AM, please contact night-coverage. How to contact the [facility] Attending or Consulting provider 7A - 7P or covering provider during after hours 7P -7A, for this patient?    1. Check the care team in [facility] and look for a) attending/consulting TR[facility] provider listed and b) the [facility] team listed 2. Log into [email or web address] and use [place]'s universal password to access. If you do not have the password, please contact the hospital operator. 3. Locate the [facility] provider you are looking for under [facility] and page to a number that you can be directly reached. 4. If you still have difficulty reaching the provider, please page the [facility] (Director on Call) for the Hospitalists listed on amion for assistance.

## 2020-04-20 NOTE — Progress Notes (Addendum)
PHARMACY CONSULT NOTE  Pharmacy Consult for Electrolyte Monitoring and Replacement   Recent Labs: Potassium (mmol/L)  Date Value  04/20/2020 3.6   Magnesium (mg/dL)  Date Value  21/94/7125 2.1   Calcium (mg/dL)  Date Value  27/11/9289 7.8 (L)   Albumin (g/dL)  Date Value  90/30/1499 2.0 (L)   Phosphorus (mg/dL)  Date Value  69/24/9324 3.0   Sodium (mmol/L)  Date Value  04/20/2020 144   Corrected Ca: ~9.4 mg/dL  Assessment: 49 year old female admitted with DKA (now resolved), and also with respiratory failure with concern for meningitis/encephalitis requiring intubation.  Patient extubated 04/17/20.  Pharmacy to manage electrolytes.  Goal of Therapy:  electrolytes WNL  Plan:   No electrolyte replacement needed   Consult initiated in ICU. Electrolytes WNL >48hrs. Pharmacy will sign off at this time.   Gardner Candle, PharmD, BCPS Clinical Pharmacist 04/20/2020 7:34 AM

## 2020-04-20 NOTE — Hospital Course (Addendum)
49 y.o. female with a history of diabetes, hyperlipidemia, migraines, depression and morbid obesity who was brought to the ED on 04/13/20 due to altered mental status after being found on the floor at home covered in feces urine and vomit by her boyfriend.  EMS was called, they reported patient was confused, agitated, unable to provide history or engage in medical care.  Blood sugar on scene read as "high."  In the ED, patient was found to be in DKA, unable to maintain patent airway due to encephalopathy.  She was admitted to ICU critically ill with multiorgan failure, intubated and required vasopressors.   5/13 intubated, sedated, DKA admitted for resp failure 5/14 CVL placed, on pressors, weaned off Insulin drip 5/15 failed weaning trials due to severe encephalopathy, started on precedex 5/17 - extubated, on nasal canula O2 5/18 - no overnight events, continues to have slow mentation with lethargy 5/19 - downgraded to med/surg 5/20 - care assumed by hospitalist service

## 2020-04-20 NOTE — Progress Notes (Signed)
Occupational Therapy Treatment Patient Details Name: Jessica Santiago MRN: [id number] DOB: [birth year] Today's Date: 04/20/2020    History of present illness Jessica Santiago is a 49 y/o F with PMH: DM. on 04/13/20, Pt was found soiled and down at home by her boyfriend. Was agitated on EMS arrival with blood glucose reading as high. BG was at 480 in ED after some fluid. Pt with DKA and ultimately intubated d/t respiratory failure as well. Failed weaning attempts on 5/15 d/t encephalopathy and was on precedex. Pt extubated 5/17. Now on room air.   OT comments  Pt seen for OT tx this date to f/u re: safety with self care ADLs/ADL mobility. Pt much more appropriate and attentive this date. Some initial grogginess at start of session, but improves with seated ADL participation. Pt requires setup to MIN A with aspects of seated UB self care this date, partially d/t fatigue/low activity tolerance. Pt still requiring increased assist with LB ADLs at this time d/t decreased dynamic sitting balance. Pt tolerates ~20 mins in EOB sitting while completing multiple aspects of grooming/bathing routine. In addition, extended time spent educating pt re: role of OT and importance of OOB activity. OT facilitates pt participation in toileting with MIN A to SPS with RW to [facility] safely, MIN A for standing peri care-mostly for stabilization d/t more dynamic balance task. Pt tolerates fxl mobility with RW  From [facility] to chair on opposite side of room to incorporate fxl household distances. Pt tolerates well. Pt's BP is generally high t/o which RN is notified of. Overall, at this time, pt still requiring assistance in multiple areas of ADL/self care and ADL mobility relative to her reported PLOF. Anticipate CIR is still most appropriate d/c recommendation at this time. Will continue to assess.    Follow Up Recommendations  CIR    Equipment Recommendations  3 in 1 bedside commode;Tub/shower seat    Recommendations for Other Services       Precautions / Restrictions Precautions Precautions: Fall Restrictions Weight Bearing Restrictions: No       Mobility Bed Mobility Overal bed mobility: Needs Assistance Bed Mobility: Supine to Sit     Supine to sit: Supervision;HOB elevated     General bed mobility comments: use of bed rails to come to sit  Transfers Overall transfer level: Needs assistance Equipment used: Rolling walker (2 wheeled) Transfers: Sit to/from [employer] Sit to Stand: Min guard Stand pivot transfers: Min assist       General transfer comment: Pt's BP before any activity 190/75, pt's BP after light activity 192/66    Balance Overall balance assessment: Needs assistance Sitting-balance support: Feet unsupported Sitting balance-Leahy Scale: Good     Standing balance support: During functional activity;Bilateral upper extremity supported Standing balance-Leahy Scale: Fair Standing balance comment: Pt on initial trial with P standing balance requiring assist to control descent to sitting d/t posterior LOB. After sitting several minutes to compelte ADLs, pt becomes more attentive/awake and demos improved balance to F with B UE support on second trial.                           ADL either performed or assessed with clinical judgement   ADL Overall ADL's : Needs assistance/impaired     Grooming: Wash/dry face;Oral care;Brushing hair;Set up;Sitting Grooming Details (indicate cue type and reason): EOB unsupported sitting Upper Body Bathing: Minimal assistance;Sitting   Lower Body Bathing: Moderate assistance;Sit to/from stand   Upper Body  Dressing : Minimal assistance;Sitting Upper Body Dressing Details (indicate cue type and reason): demo'ed improved indep with sequencing Lower Body Dressing: Moderate assistance;Maximal assistance   Toilet Transfer: Minimal assistance;Stand-pivot;BSC   Toileting- Clothing Manipulation and Hygiene: Minimal assistance;Sit  to/from stand Toileting - Clothing Manipulation Details (indicate cue type and reason): after voiding, anticipate possibly more assist needed for BM     Functional mobility during ADLs: Min guard;Minimal assistance;Rolling walker General ADL Comments: On initial fxl mobility trial, pt with some posterior LOB, requiring assist to come to sitting safely. After sitting and completing activity, pt less tired/groggy and better able to participate in fxl mobility around EOB to chair to simulate fxl household distances. Pt requires CGA with RW to sustain balance with fxl mobility as well as OT managing IV pole and VS monitor.     Vision Patient Visual Report: No change from baseline Additional Comments: Pt able to track more purposefully this date, demos increased visual attention. Pt less withdrawn/more participatory and states no notable visual changes. Able to functionally account for items in her periphery from a safety standpoint.   Perception     Praxis      Cognition Arousal/Alertness: Awake/alert Behavior During Therapy: WFL for tasks assessed/performed Overall Cognitive Status: Impaired/Different from baseline Area of Impairment: Orientation                 Orientation Level: Disoriented to;Time Current Attention Level: Focused Memory: Decreased short-term memory Following Commands: Follows one step commands consistently;Follows multi-step commands with increased time Safety/Judgement: Decreased awareness of deficits Awareness: Emergent Problem Solving: Slow processing General Comments: Pt with increased orientation this date-knows which hospital she is in. Does still recall correct month, but incorrect year w/o cues. Still requring some increased processing time, but overall, more alert and appropriate.        Exercises Other Exercises Other Exercises: OT facilitates re-education re: role of OT and pt with improved understanding verbalized this time, moderate reception of  education detected. Other Exercises: OT facilitates re-education re: importance of OOB activity including preventing further muscle atrophy, preventing skin breakdown, and preventing further infection/illness. Pt is very agreeable to getting OOB and motivated to move.   Shoulder Instructions       General Comments      Pertinent Vitals/ Pain          Home Living                                          Prior Functioning/Environment              Frequency  Min 2X/week        Progress Toward Goals  OT Goals(current goals can now be found in the care plan section)  Progress towards OT goals: Progressing toward goals  Acute Rehab OT Goals Patient Stated Goal: to balance better and get back to doing for myself OT Goal Formulation: With patient Time For Goal Achievement: 05/02/20 Potential to Achieve Goals: Good  Plan Discharge plan remains appropriate    Co-evaluation                 AM-PAC OT "6 Clicks" Daily Activity     Outcome Measure   Help from another person eating meals?: A Little Help from another person taking care of personal grooming?: A Little Help from another person toileting, which includes using toliet, bedpan, or urinal?: A  Little Help from another person bathing (including washing, rinsing, drying)?: A Lot Help from another person to put on and taking off regular upper body clothing?: A Little Help from another person to put on and taking off regular lower body clothing?: A Lot 6 Click Score: 16    End of Session Equipment Utilized During Treatment: Gait belt  OT Visit Diagnosis: Unsteadiness on feet (R26.81);Muscle weakness (generalized) (M62.81)   Activity Tolerance Patient tolerated treatment well   Patient Left in chair;with call bell/phone within reach;with chair alarm set   Nurse Communication Mobility status;Other (comment)(notified RN that pt's BP elevated before and after simple activity/mobilization.)         Time: 6283-1517 OT Time Calculation (min): 68 min  Charges: OT General Charges $OT Visit: 1 Visit OT Treatments $Self Care/Home Management : 38-52 mins $Therapeutic Activity: 23-37 mins  Gerrianne Scale, MS, OTR/L ascom [phone] 04/20/20, 1:46 PM

## 2020-04-20 NOTE — Progress Notes (Signed)
cpap refused by pt 

## 2020-04-20 NOTE — Care Plan (Signed)
CRITICAL CARE NOTE    Patient was evaluated by me 04/19/20 while in MICU/SDU.  Her conditions remains stable and improved.  Physical exam and care plan are unchanged.  I personally discussed and signed her out to hospitalist service after full evaluation .    Vida Rigger, M.D.  Pulmonary & Critical Care Medicine  [facility]

## 2020-04-21 LAB — GLUCOSE, CAPILLARY
Glucose-Capillary: 150 mg/dL — ABNORMAL HIGH (ref 70–99)
Glucose-Capillary: 191 mg/dL — ABNORMAL HIGH (ref 70–99)
Glucose-Capillary: 197 mg/dL — ABNORMAL HIGH (ref 70–99)
Glucose-Capillary: 249 mg/dL — ABNORMAL HIGH (ref 70–99)
Glucose-Capillary: 257 mg/dL — ABNORMAL HIGH (ref 70–99)

## 2020-04-21 LAB — CBC WITH DIFFERENTIAL/PLATELET
Abs Immature Granulocytes: 0.12 10*3/uL — ABNORMAL HIGH (ref 0.00–0.07)
Basophils Absolute: 0 10*3/uL (ref 0.0–0.1)
Basophils Relative: 0 %
Eosinophils Absolute: 0.8 10*3/uL — ABNORMAL HIGH (ref 0.0–0.5)
Eosinophils Relative: 9 %
HCT: 32.2 % — ABNORMAL LOW (ref 36.0–46.0)
Hemoglobin: 10.4 g/dL — ABNORMAL LOW (ref 12.0–15.0)
Immature Granulocytes: 1 %
Lymphocytes Relative: 30 %
Lymphs Abs: 2.7 10*3/uL (ref 0.7–4.0)
MCH: 31.3 pg (ref 26.0–34.0)
MCHC: 32.3 g/dL (ref 30.0–36.0)
MCV: 97 fL (ref 80.0–100.0)
Monocytes Absolute: 1.3 10*3/uL — ABNORMAL HIGH (ref 0.1–1.0)
Monocytes Relative: 14 %
Neutro Abs: 4 10*3/uL (ref 1.7–7.7)
Neutrophils Relative %: 46 %
Platelets: 281 10*3/uL (ref 150–400)
RBC: 3.32 MIL/uL — ABNORMAL LOW (ref 3.87–5.11)
RDW: 13.1 % (ref 11.5–15.5)
WBC: 8.9 10*3/uL (ref 4.0–10.5)
nRBC: 0 % (ref 0.0–0.2)

## 2020-04-21 LAB — BASIC METABOLIC PANEL
Anion gap: 6 (ref 5–15)
BUN: 20 mg/dL (ref 6–20)
CO2: 27 mmol/L (ref 22–32)
Calcium: 8.1 mg/dL — ABNORMAL LOW (ref 8.9–10.3)
Chloride: 110 mmol/L (ref 98–111)
Creatinine, Ser: 0.84 mg/dL (ref 0.44–1.00)
GFR calc Af Amer: >60 mL/min (ref >60)
GFR calc non Af Amer: >60 mL/min (ref >60)
Glucose, Bld: 201 mg/dL — ABNORMAL HIGH (ref 70–99)
Potassium: 3.8 mmol/L (ref 3.5–5.1)
Sodium: 143 mmol/L (ref 135–145)

## 2020-04-21 LAB — MAGNESIUM: Magnesium: 2 mg/dL (ref 1.7–2.4)

## 2020-04-21 LAB — PHOSPHORUS: Phosphorus: 3.2 mg/dL (ref 2.5–4.6)

## 2020-04-21 MED ORDER — ADULT MULTIVITAMIN W/MINERALS CH: 1.0000 | ORAL_TABLET | Freq: Every day | ORAL | Status: AC

## 2020-04-21 MED ORDER — LOSARTAN POTASSIUM 50 MG PO TABS
100.0000 mg | ORAL_TABLET | Freq: Every day | ORAL | Status: DC
  Administered 2020-04-21: 100 mg via ORAL
  Filled 2020-04-21: qty 2

## 2020-04-21 MED ORDER — INSULIN GLARGINE 100 UNIT/ML ~~LOC~~ SOLN: 20.0000 [IU] | Freq: Every day | SUBCUTANEOUS | 11 refills | Status: AC

## 2020-04-21 MED ORDER — INSULIN ASPART 100 UNIT/ML ~~LOC~~ SOLN: 6.0000 [IU] | Freq: Three times a day (TID) | SUBCUTANEOUS | 11 refills | Status: AC

## 2020-04-21 MED ORDER — ENSURE MAX PROTEIN PO LIQD: 11.0000 [oz_av] | Freq: Two times a day (BID) | ORAL | Status: AC

## 2020-04-21 MED ORDER — LOSARTAN POTASSIUM 100 MG PO TABS: 100.0000 mg | ORAL_TABLET | Freq: Every day | ORAL | 1 refills | Status: DC

## 2020-04-21 NOTE — TOC Progression Note (Signed)
Transition of Care [facility]) - Progression Note    Patient Details  Name: Jessica Santiago MRN: [id number] Date of Birth: November 18, 1971  Transition of Care [facility] CM/SW Contact  Margarito Liner, LCSW Phone Number: 04/21/2020, 9:31 AM  Clinical Narrative: Patient prefers to return home at discharge rather than CIR/SNF. She is not interested in home health at this time. Told her if she gets home and changes her mind, to notify her PCP. Provided outpatient therapy clinic list if she decides she wants to pursue that. Encouraged her to let CSW know if she wants one of the [facility] campuses so that order can be signed and faxed over. Told her if she wants one of the outside clinics, to call and make an appt and they will get the orders from her PCP. Patient agreeable to DME recommendations for a rolling walker and 3-in-1. Called [employer] representative to order. Asked MD for DME orders.     Expected Discharge Plan: IP Rehab Facility Barriers to Discharge: Continued Medical Work up  Expected Discharge Plan and Services Expected Discharge Plan: IP Rehab Facility     Post Acute Care Choice: IP Rehab Living arrangements for the past 2 months: Mobile Home                                       Social Determinants of Health (SDOH) Interventions    Readmission Risk Interventions No flowsheet data found.

## 2020-04-21 NOTE — Progress Notes (Signed)
Inpatient Rehabilitation-Admissions Coordinator   Spoke to [facility] team this AM. Pt still wanting to go home per their report. AC will not pursue CIR and will sign off.   Please call if questions.   Cheri Rous, OTR/L  Rehab Admissions Coordinator  [phone] 04/21/2020 9:31 AM

## 2020-04-21 NOTE — Discharge Summary (Signed)
Physician Discharge Summary  Jessica Santiago OYD:[id number] DOB: 1971-04-14 DOA: 04/13/2020  PCP: No primary care provider on file.  Admit date: 04/13/2020 Discharge date: 04/21/2020  Admitted From: home Disposition:  home  Recommendations for Outpatient Follow-up:  1. Follow up with PCP in 1-2 weeks 2. Please obtain BMP/CBC in one week 3. Please follow up on patient's blood pressure.  It appears she was not taking losartan or HCTZ at the time of admission.  She was started on losartan. 4. Please follow up on patient's insulin regimen and glycemic control.  She presented in DKA.  Insulin regimen was adjusted from what patient reported prior to admission.  Request patient bring log of her blood sugars to follow up appointment.  Home Health: No - patient declined CIR, SNF and home health services Equipment/Devices: rolling walker, 3-n-1  Discharge Condition: Stable  CODE STATUS: Full Diet recommendation: Heart Healthy / Carb Modified    Discharge Diagnoses: Active Problems:   Acute respiratory failure with hypoxia (HCC)    Summary of HPI and Hospital Course:  49 y.o. female with a history of diabetes, hyperlipidemia, migraines, depression and morbid obesity who was brought to the ED on 04/13/20 due to altered mental status after being found on the floor at home covered in feces urine and vomit by her boyfriend.  EMS was called, they reported patient was confused, agitated, unable to provide history or engage in medical care.  Blood sugar on scene read as "high."  In the ED, patient was found to be in DKA, unable to maintain patent airway due to encephalopathy.  She was admitted to ICU critically ill with multiorgan failure, intubated and required vasopressors.   5/13 intubated, sedated, DKA admitted for resp failure 5/14 CVL placed, on pressors, weaned off Insulin drip 5/15 failed weaning trials due to severe encephalopathy, started on precedex 5/17 - extubated, on nasal canula O2 5/18 -  no overnight events, continues to have slow mentation with lethargy 5/19 - downgraded to med/surg 5/20 - care assumed by hospitalist service    Finished Antibiotics Lantus 20 HS, Novolog 6 TID  Losartan for BP  Severe ACUTE Hypoxic and Hypercapnic Respiratory Failuredue to Severe Diabetic Ketoacidosis with Toxic Metabolic Encephalopathycomplicated by fevers etiology probable c/w CAP Sepsis secondary to Community-acquired Pneumonia - present on admission. --Meningitis/encephalitis ruled out - CSF studies showed atypical profile with lymphocytic predominant leukocytosis, cultures negative.  Patient was treated empirically in ICU initially.  --Antibiotics de-escalated and patient continued to improve clinically. --DKA resolved with Endotool and insulin drip. --Respiratory failure required intubation and mechanical ventilation.  Extubated 5/17. --Completed course of Rocephin prior to discharge  Acute Kidney Injury - POA, likely due to sepsis and hypotension.  Resolved.  Foley d/c'd.   --monitor renal function  Type 2 Diabetes - presented in DKA.  Uncontrolled.  A1c is 10.7%.  Home regimen is Lantus 10 units QHS, Novolog 10 units TID with meals.   --Levemir 20 units qHS --Novolog 6 units TID WC, ssi --close PCP follow up, patient advised to bring glucose readings  Morbid obesity,  Body mass index is 46.46 kg/m.  Complicates overall care and prognosis Obstructive Sleep Apnea  --impacts respiratory mechanics, overall care and prognosis. --recommend sleep study as outpatient    Discharge Instructions   Discharge Instructions    Call MD for:   Complete by: As directed    Uncontrolled blood sugar (high or low).   Call MD for:  extreme fatigue   Complete by: As directed  Call MD for:  persistant dizziness or light-headedness   Complete by: As directed    Call MD for:  persistant nausea and vomiting   Complete by: As directed    Call MD for:  temperature >100.4   Complete by:  As directed    Diet - low sodium heart healthy   Complete by: As directed    Discharge instructions   Complete by: As directed    Monitor your blood sugar closely.  Please follow up with your primary care doctor next week and bring a log of your blood sugars so they can make any adjustments to your insulin.   Increase activity slowly   Complete by: As directed      Allergies as of 04/21/2020   Not on File     Medication List    TAKE these medications   Ensure Max Protein Liqd Take 330 mLs (11 oz total) by mouth 2 (two) times daily.   insulin aspart 100 UNIT/ML injection Commonly known as: novoLOG Inject 6 Units into the skin 3 (three) times daily with meals. What changed: how much to take   insulin glargine 100 UNIT/ML injection Commonly known as: LANTUS Inject 0.2 mLs (20 Units total) into the skin at bedtime. What changed: how much to take   losartan 100 MG tablet Commonly known as: COZAAR Take 1 tablet (100 mg total) by mouth daily. Start taking on: Apr 22, 2020   multivitamin with minerals Tabs tablet Take 1 tablet by mouth daily. Start taking on: Apr 22, 2020            Durable Medical Equipment  (From admission, onward)         Start     Ordered   04/21/20 0953  For home use only DME 3 n 1  Once     04/21/20 4098   04/21/20 0952  For home use only DME Walker rolling  Once    Question Answer Comment  Walker: With 5 Inch Wheels   Patient needs a walker to treat with the following condition Generalized weakness      04/21/20 0952          Not on File  Consultations:  PCCM  Neurology    Procedures/Studies: EEG  Result Date: 04/13/2020 Thana Farr, MD     04/13/2020  3:26 PM ELECTROENCEPHALOGRAM REPORT Patient: Jessica Santiago       Room #: IC14A-AA EEG No. ID: [id number] Age: 49 y.o.        Sex: female Requesting Physician: Kasa Report Date:  04/13/2020       Interpreting Physician: Thana Farr History: Jessica Santiago is an 49 y.o. female  with DKA and AMS Medications: Fentanyl, Propofol, Insulin Conditions of Recording:  This is a 21 channel routine scalp EEG performed with bipolar and monopolar montages arranged in accordance to the international 10/20 system of electrode placement. One channel was dedicated to EKG recording. The patient is in the intubated and sedated state.  Patient is positioned with her head to the left. Description: Artifact is prominent during the recording.  The background activity is discontinuous. It consists of bursts of polyspike, spike and slow wave activity alternating with periods of attenuation. The burst activity lasts up to 3 seconds and is generalized for the most part although there are some occasions when this burst activity is more prominent from the left hemisphere.  The periods of attenuation last up to 12 seconds. This discontinuous activity is maintained throughout the tracing.  No activation procedures were performed IMPRESSION: This is an abnormal EEG due to a burst-suppression pattern seen throughout the tracing.  Burst suppression pattern can be seen in a variety of circumstances, including anesthesia, drug intoxication, hypothermia, as well as cerebral anoxia.  Clinical/neurological, and radiographic correlation advised.  Thana Farr, MD Neurology [phone] 04/13/2020, 3:19 PM   DG Abdomen 1 View  Result Date: 04/13/2020 CLINICAL DATA:  Orogastric tube placement EXAM: ABDOMEN - 1 VIEW COMPARISON:  None. FINDINGS: Orogastric tube tip and side port in stomach. Moderate stool in colon. No bowel dilatation or air-fluid level to suggest bowel obstruction. No free air. IMPRESSION: Orogastric tube tip and side port in stomach. No bowel obstruction or free air evident on supine examination. Electronically Signed   By: Bretta Bang III M.D.   On: 04/13/2020 11:12   CT HEAD WO CONTRAST  Result Date: 04/13/2020 CLINICAL DATA:  Altered mental status. The patient is unresponsive. No known injury.  EXAM: CT HEAD WITHOUT CONTRAST TECHNIQUE: Contiguous axial images were obtained from the base of the skull through the vertex without intravenous contrast. COMPARISON:  None. FINDINGS: Brain: No evidence of acute infarction, hemorrhage, hydrocephalus, extra-axial collection or mass lesion/mass effect. Vascular: Atherosclerosis noted. Skull: Intact.  No focal lesion. Sinuses/Orbits: Negative. Other: None. IMPRESSION: No acute abnormality. Atherosclerosis. Electronically Signed   By: Drusilla Kanner M.D.   On: 04/13/2020 14:14   DG Chest Port 1 View  Result Date: 04/18/2020 CLINICAL DATA:  Status postextubation EXAM: PORTABLE CHEST 1 VIEW COMPARISON:  Apr 14, 2020 FINDINGS: Endotracheal tube and nasogastric tube have been removed. Central catheter tip is in the superior vena cava. No pneumothorax. Lungs are now clear. Heart is borderline enlarged with pulmonary vascularity normal. No adenopathy. No bone lesions. IMPRESSION: Central catheter tip in superior vena cava. No pneumothorax. Lungs clear. Borderline cardiomegaly. Electronically Signed   By: Bretta Bang III M.D.   On: 04/18/2020 11:24   DG Chest Port 1 View  Result Date: 04/14/2020 CLINICAL DATA:  Central line placement, intubated EXAM: PORTABLE CHEST 1 VIEW COMPARISON:  04/13/2020 FINDINGS: Single frontal view of the chest demonstrates stable endotracheal and enteric catheters. Left internal jugular catheter tip projects over superior vena cava. Cardiac silhouette is stable. There is persistent central vascular congestion, with developing patchy bilateral airspace disease. No effusion or pneumothorax. No acute bony abnormalities. IMPRESSION: 1. Support devices as above. 2. Progressive central vascular congestion, with developing patchy bilateral airspace disease. Findings could reflect edema or infection. Electronically Signed   By: Sharlet Salina M.D.   On: 04/14/2020 01:12   DG Chest Portable 1 View  Result Date: 04/13/2020 CLINICAL DATA:   Hypoxia and confusion EXAM: PORTABLE CHEST 1 VIEW COMPARISON:  None. FINDINGS: Endotracheal tube tip is 3.2 cm above the carina. Nasogastric tube tip and side port are below the diaphragm. Side port is seen in the stomach. No pneumothorax. There is left base atelectasis. Lungs elsewhere clear. Heart is slightly enlarged with pulmonary vascularity normal. No adenopathy. No bone lesions. IMPRESSION: Tube positions as described without pneumothorax. Left base atelectasis. Lungs elsewhere clear. Heart prominent. Electronically Signed   By: Bretta Bang III M.D.   On: 04/13/2020 11:10   DG FL GUIDED LUMBAR PUNCTURE  Result Date: 04/14/2020 CLINICAL DATA:  Suspected meningitis. Request for diagnostic lumbar puncture under fluoroscopic guidance. EXAM: DIAGNOSTIC LUMBAR PUNCTURE UNDER FLUOROSCOPIC GUIDANCE FLUOROSCOPY TIME:  Fluoroscopy Time:  1 minutes 18 seconds Number of Acquired Spot Images: 2 PROCEDURE: Patient is intubated. Informed consent was  obtained from the patient's son prior to the procedure, including potential complications of headache, allergy, and pain. With the patient prone, the lower back was prepped with Betadine. 1% Lidocaine was used for local anesthesia. Lumbar puncture was performed at the L4-5 level with a right interlaminar approach using a 22 gauge 5 inch needle with return of initially pink tinged and subsequently clear CSF with an opening pressure of 22 cm water. 8 ml of CSF were obtained for laboratory studies. The patient tolerated the procedure well and there were no apparent complications. IMPRESSION: Technically successful diagnostic lumbar puncture under fluoroscopic guidance. Electronically Signed   By: Delbert Phenix M.D.   On: 04/14/2020 16:56       Subjective: Patient seen this AM, reports she feels well.  Declines to go to rehab, wants to return home with sons who she says can help her with any needs.  Denies fever/chills, CP, SOB, N/V/D or other acute  complaints.   Discharge Exam: Vitals:   04/21/20 0423 04/21/20 0654  BP: (!) 174/87 (!) 153/79  Pulse: 94 92  Resp: 20   Temp: 98 F (36.7 C)   SpO2: 98%    Vitals:   04/20/20 1222 04/20/20 2017 04/21/20 0423 04/21/20 0654  BP:  (!) 165/90 (!) 174/87 (!) 153/79  Pulse: (!) 102 97 94 92  Resp:  20 20   Temp: (!) 97.3 F (36.3 C) 98.2 F (36.8 C) 98 F (36.7 C)   TempSrc:   Oral   SpO2: 99% 100% 98%   Weight:      Height:        General: Pt is alert, awake, not in acute distress, morbidly obese Cardiovascular: RRR, S1/S2 +, no rubs, no gallops Respiratory: CTA bilaterally, no wheezing, no rhonchi Abdominal: Soft, NT, ND, bowel sounds + Extremities: nonpitting BLE edema, no cyanosis    The results of significant diagnostics from this hospitalization (including imaging, microbiology, ancillary and laboratory) are listed below for reference.     Microbiology: Recent Results (from the past 240 hour(s))  MRSA PCR Screening     Status: None   Collection Time: 04/13/20  9:14 AM   Specimen: Nasopharyngeal  Result Value Ref Range Status   MRSA by PCR NEGATIVE NEGATIVE Final    Comment:        The GeneXpert MRSA Assay (FDA approved for NASAL specimens only), is one component of a comprehensive MRSA colonization surveillance program. It is not intended to diagnose MRSA infection nor to guide or monitor treatment for MRSA infections. Performed at [facility], [address] [place], [place] [address]   Culture, blood (routine x 2)     Status: None   Collection Time: 04/13/20  9:17 AM   Specimen: BLOOD  Result Value Ref Range Status   Specimen Description BLOOD BLOOD RIGHT HAND  Final   Special Requests   Final    BOTTLES DRAWN AEROBIC AND ANAEROBIC Blood Culture adequate volume   Culture   Final    NO GROWTH 5 DAYS Performed at [facility], [address], [place], [place] [address]    Report Status 04/18/2020 FINAL  Final  Culture, blood  (routine x 2)     Status: None   Collection Time: 04/13/20  9:17 AM   Specimen: BLOOD  Result Value Ref Range Status   Specimen Description BLOOD BLOOD LEFT HAND  Final   Special Requests   Final    BOTTLES DRAWN AEROBIC AND ANAEROBIC Blood Culture adequate  volume   Culture   Final    NO GROWTH 5 DAYS Performed at [facility], [address] [place], [place] [address]    Report Status 04/18/2020 FINAL  Final  SARS Coronavirus 2 by RT PCR (hospital order, performed in [facility] hospital lab) Nasopharyngeal Nasopharyngeal Swab     Status: None   Collection Time: 04/13/20  9:17 AM   Specimen: Nasopharyngeal Swab  Result Value Ref Range Status   SARS Coronavirus 2 NEGATIVE NEGATIVE Final    Comment: (NOTE) SARS-CoV-2 target nucleic acids are NOT DETECTED. The SARS-CoV-2 RNA is generally detectable in upper and lower respiratory specimens during the acute phase of infection. The lowest concentration of SARS-CoV-2 viral copies this assay can detect is 250 copies / mL. A negative result does not preclude SARS-CoV-2 infection and should not be used as the sole basis for treatment or other patient management decisions.  A negative result may occur with improper specimen collection / handling, submission of specimen other than nasopharyngeal swab, presence of viral mutation(s) within the areas targeted by this assay, and inadequate number of viral copies (<250 copies / mL). A negative result must be combined with clinical observations, patient history, and epidemiological information. Fact Sheet for Patients:   BoilerBrush.com.cy Fact Sheet for Healthcare Providers: https://pope.com/ This test is not yet approved or cleared  by the [place] FDA and has been authorized for detection and/or diagnosis of SARS-CoV-2 by FDA under an Emergency Use Authorization (EUA).  This EUA will remain in effect (meaning this test can be used)  for the duration of the COVID-19 declaration under Section 564(b)(1) of the Act, 21 U.S.C. section 360bbb-3(b)(1), unless the authorization is terminated or revoked sooner. Performed at [facility], [address] [place], [place] [address]   Urine culture     Status: None   Collection Time: 04/13/20 10:27 AM   Specimen: Urine, Random  Result Value Ref Range Status   Specimen Description   Final    URINE, RANDOM Performed at [facility], [address], [place], [place] [address]    Special Requests   Final    NONE Performed at [facility], [address] [place], [place] [address]    Culture   Final    NO GROWTH Performed at [facility], [address] [place], [place] [address]    Report Status 04/14/2020 FINAL  Final  CSF culture     Status: None   Collection Time: 04/14/20  2:00 PM   Specimen: CSF; Cerebrospinal Fluid  Result Value Ref Range Status   Specimen Description   Final    CSF Performed at [facility], [address] [place], [place] [address]    Special Requests   Final    Normal Performed at [facility], [address] [place], [place] [address]    Gram Stain WBC SEEN RED BLOOD CELLS NO ORGANISMS SEEN   Final   Culture   Final    NO GROWTH Performed at [facility], [address] [place], [place] [address]    Report Status 04/17/2020 FINAL  Final  Anaerobic culture     Status: None   Collection Time: 04/14/20  4:38 PM   Specimen: PATH Cytology CSF; Cerebrospinal Fluid  Result Value Ref Range Status   Specimen Description   Final    CSF Performed at [facility], [address], [place], [place] [address]    Special Requests  Final    NONE Performed at [facility], [address] [place], [place] [address]    Culture   Final    NO ANAEROBES ISOLATED Performed at [facility], [place] [address] [place], [place] [address]    Report Status  04/20/2020 FINAL  Final  Gram stain     Status: None   Collection Time: 04/14/20  4:38 PM   Specimen: PATH Cytology CSF; Cerebrospinal Fluid  Result Value Ref Range Status   Specimen Description CSF  Final   Special Requests NONE  Final   Gram Stain   Final    NO ORGANISMS SEEN WBC SEEN RED BLOOD CELLS SEEN Performed at [facility], [address] [place], [place] [address]    Report Status 04/15/2020 FINAL  Final  Culture, fungus without smear     Status: None (Preliminary result)   Collection Time: 04/14/20  4:38 PM   Specimen: PATH Cytology CSF; Cerebrospinal Fluid  Result Value Ref Range Status   Specimen Description   Final    CSF Performed at [facility], [address], [place], [place] [address]    Special Requests   Final    NONE Performed at [facility], [address] [place], [place] [address]    Culture   Final    NO FUNGUS ISOLATED AFTER 6 DAYS Performed at [facility], [place] [address], [place], [place] [address]    Report Status PENDING  Incomplete     Labs: BNP (last 3 results) No results for input(s): BNP in the last 8760 hours. Basic Metabolic Panel: Recent Labs  Lab 04/17/20 0602 04/18/20 0425 04/19/20 0440 04/20/20 0629 04/21/20 0459  NA 141 142 145 144 143  K 3.4* 3.5 3.7 3.6 3.8  CL 110 112* 113* 114* 110  CO2 22 23 26 25 27   GLUCOSE 151* 165* 111* 110* 201*  BUN 46* 45* 35* 23* 20  CREATININE 0.91 1.01* 0.86 0.93 0.84  CALCIUM 8.1* 7.7* 7.9* 7.8* 8.1*  MG 2.2 2.3 2.3 2.1 2.0  PHOS 3.2 2.6 2.5 3.0 3.2   Liver Function Tests: Recent Labs  Lab 04/18/20 0425 04/19/20 0440  ALBUMIN 1.8* 2.0*   No results for input(s): LIPASE, AMYLASE in the last 168 hours. No results for input(s): AMMONIA in the last 168 hours. CBC: Recent Labs  Lab 04/17/20 0602 04/18/20 0425 04/19/20 0440 04/20/20 0629 04/21/20 0459  WBC 23.0* 14.0* 11.7* 9.4 8.9  NEUTROABS 14.9* 8.3* 7.1 4.6 4.0  HGB 15.5* 11.4*  10.7* 10.2* 10.4*  HCT 46.7* 35.1* 33.6* 31.5* 32.2*  MCV 94.5 96.2 96.3 94.9 97.0  PLT 411* 223 242 261 281   Cardiac Enzymes: No results for input(s): CKTOTAL, CKMB, CKMBINDEX, TROPONINI in the last 168 hours. BNP: Invalid input(s): POCBNP CBG: Recent Labs  Lab 04/20/20 1649 04/20/20 2017 04/21/20 0025 04/21/20 0421 04/21/20 0819  GLUCAP 272* 257* 249* 197* 150*   D-Dimer No results for input(s): DDIMER in the last 72 hours. Hgb A1c No results for input(s): HGBA1C in the last 72 hours. Lipid Profile No results for input(s): CHOL, HDL, LDLCALC, TRIG, CHOLHDL, LDLDIRECT in the last 72 hours. Thyroid function studies No results for input(s): TSH, T4TOTAL, T3FREE, THYROIDAB in the last 72 hours.  Invalid input(s): FREET3 Anemia work up No results for input(s): VITAMINB12, FOLATE, FERRITIN, TIBC, IRON, RETICCTPCT in the last 72 hours. Urinalysis    Component Value Date/Time   COLORURINE STRAW (A) 04/13/2020 1027  APPEARANCEUR CLEAR (A) 04/13/2020 1027   LABSPEC 1.024 04/13/2020 1027   PHURINE 5.0 04/13/2020 1027   GLUCOSEU >=500 (A) 04/13/2020 1027   HGBUR SMALL (A) 04/13/2020 1027   BILIRUBINUR NEGATIVE 04/13/2020 1027   KETONESUR 80 (A) 04/13/2020 1027   PROTEINUR >=300 (A) 04/13/2020 1027   NITRITE NEGATIVE 04/13/2020 1027   LEUKOCYTESUR NEGATIVE 04/13/2020 1027   Sepsis Labs Invalid input(s): PROCALCITONIN,  WBC,  LACTICIDVEN Microbiology Recent Results (from the past 240 hour(s))  MRSA PCR Screening     Status: None   Collection Time: 04/13/20  9:14 AM   Specimen: Nasopharyngeal  Result Value Ref Range Status   MRSA by PCR NEGATIVE NEGATIVE Final    Comment:        The GeneXpert MRSA Assay (FDA approved for NASAL specimens only), is one component of a comprehensive MRSA colonization surveillance program. It is not intended to diagnose MRSA infection nor to guide or monitor treatment for MRSA infections. Performed at [facility], [address] [place], [place] [address]   Culture, blood (routine x 2)     Status: None   Collection Time: 04/13/20  9:17 AM   Specimen: BLOOD  Result Value Ref Range Status   Specimen Description BLOOD BLOOD RIGHT HAND  Final   Special Requests   Final    BOTTLES DRAWN AEROBIC AND ANAEROBIC Blood Culture adequate volume   Culture   Final    NO GROWTH 5 DAYS Performed at [facility], [address] [place], [place] [address]    Report Status 04/18/2020 FINAL  Final  Culture, blood (routine x 2)     Status: None   Collection Time: 04/13/20  9:17 AM   Specimen: BLOOD  Result Value Ref Range Status   Specimen Description BLOOD BLOOD LEFT HAND  Final   Special Requests   Final    BOTTLES DRAWN AEROBIC AND ANAEROBIC Blood Culture adequate volume   Culture   Final    NO GROWTH 5 DAYS Performed at [facility], [address], [place], [place] [address]    Report Status 04/18/2020 FINAL  Final  SARS Coronavirus 2 by RT PCR (hospital order, performed in [facility] hospital lab) Nasopharyngeal Nasopharyngeal Swab     Status: None   Collection Time: 04/13/20  9:17 AM   Specimen: Nasopharyngeal Swab  Result Value Ref Range Status   SARS Coronavirus 2 NEGATIVE NEGATIVE Final    Comment: (NOTE) SARS-CoV-2 target nucleic acids are NOT DETECTED. The SARS-CoV-2 RNA is generally detectable in upper and lower respiratory specimens during the acute phase of infection. The lowest concentration of SARS-CoV-2 viral copies this assay can detect is 250 copies / mL. A negative result does not preclude SARS-CoV-2 infection and should not be used as the sole basis for treatment or other patient management decisions.  A negative result may occur with improper specimen collection / handling, submission of specimen other than nasopharyngeal swab, presence of viral mutation(s) within the areas targeted by this assay, and inadequate number of viral copies (<250 copies / mL). A  negative result must be combined with clinical observations, patient history, and epidemiological information. Fact Sheet for Patients:   BoilerBrush.com.cy Fact Sheet for Healthcare Providers: https://pope.com/ This test is not yet approved or cleared  by the [place] FDA and has been authorized for detection and/or diagnosis of SARS-CoV-2 by FDA under an Emergency Use Authorization (EUA).  This EUA will remain in effect (meaning this test can be used)  for the duration of the COVID-19 declaration under Section 564(b)(1) of the Act, 21 U.S.C. section 360bbb-3(b)(1), unless the authorization is terminated or revoked sooner. Performed at [facility], [address] [place], [place] [id number]   Urine culture     Status: None   Collection Time: 04/13/20 10:27 AM   Specimen: Urine, Random  Result Value Ref Range Status   Specimen Description   Final    URINE, RANDOM Performed at [facility], [address] [place], [place] [address]    Special Requests   Final    NONE Performed at [facility], [address] [place], [place] [address]    Culture   Final    NO GROWTH Performed at [facility], [address] [place], [place] [address]    Report Status 04/14/2020 FINAL  Final  CSF culture     Status: None   Collection Time: 04/14/20  2:00 PM   Specimen: CSF; Cerebrospinal Fluid  Result Value Ref Range Status   Specimen Description   Final    CSF Performed at [facility], [address] [place], [place] [address]    Special Requests   Final    Normal Performed at [facility], [address] [place], [place] [address]    Gram Stain WBC SEEN RED BLOOD CELLS NO ORGANISMS SEEN   Final   Culture   Final    NO GROWTH Performed at [facility], [address] [place], [place] [address]    Report Status 04/17/2020 FINAL  Final  Anaerobic culture      Status: None   Collection Time: 04/14/20  4:38 PM   Specimen: PATH Cytology CSF; Cerebrospinal Fluid  Result Value Ref Range Status   Specimen Description   Final    CSF Performed at [facility], [address] [place], [place] [address]    Special Requests   Final    NONE Performed at [facility], [address] [place], [place] [address]    Culture   Final    NO ANAEROBES ISOLATED Performed at [facility], [address] [place], [place] [address]    Report Status 04/20/2020 FINAL  Final  Gram stain     Status: None   Collection Time: 04/14/20  4:38 PM   Specimen: PATH Cytology CSF; Cerebrospinal Fluid  Result Value Ref Range Status   Specimen Description CSF  Final   Special Requests NONE  Final   Gram Stain   Final    NO ORGANISMS SEEN WBC SEEN RED BLOOD CELLS SEEN Performed at [facility], [address] [place], [place] [address]    Report Status 04/15/2020 FINAL  Final  Culture, fungus without smear     Status: None (Preliminary result)   Collection Time: 04/14/20  4:38 PM   Specimen: PATH Cytology CSF; Cerebrospinal Fluid  Result Value Ref Range Status   Specimen Description   Final    CSF Performed at [facility], [address] [place], [place] [address]    Special Requests   Final    NONE Performed at [facility], [address] [place], [place] [address]    Culture   Final    NO FUNGUS ISOLATED AFTER 6 DAYS Performed at [facility], [address] [place], [place] [address]    Report Status PENDING  Incomplete     Time coordinating discharge: Over 30 minutes  SIGNED:   Pennie Banter, DO [facility] 04/21/2020, 10:00 AM   If 7PM-7AM, please contact night-coverage [email or web address]

## 2020-04-21 NOTE — TOC Transition Note (Signed)
Transition of Care [facility]) - CM/SW Discharge Note   Patient Details  Name: Jessica Santiago MRN: [id number] Date of Birth: January 01, 1971  Transition of Care [facility]) CM/SW Contact:  Margarito Liner, LCSW Phone Number: 04/21/2020, 10:04 AM   Clinical Narrative: Patient has orders to discharge home today. Notified nurse that patient will be ready to discharge from [facility] standpoint once rolling walker and 3-in-1 have been delivered to the room. Patient told CSW her boyfriend should be able to pick her up. No further concerns. CSW signing off.    Final next level of care: Home/Self Care Barriers to Discharge: Barriers Resolved   Patient Goals and CMS Choice   CMS Medicare.gov Compare Post Acute Care list provided to:: Patient    Discharge Placement                Patient to be transferred to facility by: Patient said her boyfriend should be able to pick her up.   Patient and family notified of of transfer: 04/21/20  Discharge Plan and Services     Post Acute Care Choice: IP Rehab          DME Arranged: Dan Humphreys rolling, 3-N-1 DME Agency: [employer] Date DME Agency Contacted: 04/21/20   Representative spoke with at DME Agency: Oletha Cruel            Social Determinants of Health (SDOH) Interventions     Readmission Risk Interventions No flowsheet data found.

## 2020-04-21 NOTE — Progress Notes (Signed)
Pt discharged per MD order. IV removed. Discharge instructions reviewed with pt. Pt verbalized understanding with all questions answered to satisfaction. Bedside commode and walker delivered to pt. Pt taken downstairs in wheelchair by staff.

## 2020-04-28 DIAGNOSIS — Z794 Long term (current) use of insulin: Secondary | ICD-10-CM | POA: Diagnosis not present

## 2020-04-28 DIAGNOSIS — Z79899 Other long term (current) drug therapy: Secondary | ICD-10-CM | POA: Diagnosis not present

## 2020-04-28 DIAGNOSIS — N179 Acute kidney failure, unspecified: Secondary | ICD-10-CM | POA: Insufficient documentation

## 2020-04-28 DIAGNOSIS — G43909 Migraine, unspecified, not intractable, without status migrainosus: Secondary | ICD-10-CM | POA: Insufficient documentation

## 2020-04-28 DIAGNOSIS — R531 Weakness: Secondary | ICD-10-CM | POA: Insufficient documentation

## 2020-04-28 DIAGNOSIS — R1084 Generalized abdominal pain: Secondary | ICD-10-CM | POA: Diagnosis present

## 2020-04-28 DIAGNOSIS — E1165 Type 2 diabetes mellitus with hyperglycemia: Secondary | ICD-10-CM | POA: Diagnosis not present

## 2020-04-28 DIAGNOSIS — I10 Essential (primary) hypertension: Secondary | ICD-10-CM | POA: Insufficient documentation

## 2020-04-29 ENCOUNTER — Emergency Department: Payer: Medicaid Other

## 2020-04-29 ENCOUNTER — Other Ambulatory Visit: Payer: Self-pay

## 2020-04-29 ENCOUNTER — Emergency Department
Admission: EM | Admit: 2020-04-29 | Discharge: 2020-04-29 | Disposition: A | Discharge status: Home / Self Care | Payer: Medicaid Other | Attending: Emergency Medicine | Admitting: Emergency Medicine

## 2020-04-29 DIAGNOSIS — G43909 Migraine, unspecified, not intractable, without status migrainosus: Secondary | ICD-10-CM

## 2020-04-29 DIAGNOSIS — R1084 Generalized abdominal pain: Secondary | ICD-10-CM

## 2020-04-29 DIAGNOSIS — N179 Acute kidney failure, unspecified: Secondary | ICD-10-CM

## 2020-04-29 DIAGNOSIS — R739 Hyperglycemia, unspecified: Secondary | ICD-10-CM

## 2020-04-29 LAB — URINALYSIS, COMPLETE (UACMP) WITH MICROSCOPIC
Bacteria, UA: NONE SEEN
Bilirubin Urine: NEGATIVE
Glucose, UA: >=500 mg/dL — AB
Hgb urine dipstick: NEGATIVE
Ketones, ur: 20 mg/dL — AB
Leukocytes,Ua: NEGATIVE
Nitrite: NEGATIVE
Protein, ur: >=300 mg/dL — AB
Specific Gravity, Urine: 1.024 (ref 1.005–1.030)
pH: 7 (ref 5.0–8.0)

## 2020-04-29 LAB — COMPREHENSIVE METABOLIC PANEL
ALT: 17 U/L (ref 0–44)
AST: 14 U/L — ABNORMAL LOW (ref 15–41)
Albumin: 2.9 g/dL — ABNORMAL LOW (ref 3.5–5.0)
Alkaline Phosphatase: 94 U/L (ref 38–126)
Anion gap: 13 (ref 5–15)
BUN: 25 mg/dL — ABNORMAL HIGH (ref 6–20)
CO2: 29 mmol/L (ref 22–32)
Calcium: 9.3 mg/dL (ref 8.9–10.3)
Chloride: 88 mmol/L — ABNORMAL LOW (ref 98–111)
Creatinine, Ser: 1.42 mg/dL — ABNORMAL HIGH (ref 0.44–1.00)
GFR calc Af Amer: 50 mL/min — ABNORMAL LOW (ref >60)
GFR calc non Af Amer: 44 mL/min — ABNORMAL LOW (ref >60)
Glucose, Bld: 468 mg/dL — ABNORMAL HIGH (ref 70–99)
Potassium: 3.4 mmol/L — ABNORMAL LOW (ref 3.5–5.1)
Sodium: 130 mmol/L — ABNORMAL LOW (ref 135–145)
Total Bilirubin: 1.3 mg/dL — ABNORMAL HIGH (ref 0.3–1.2)
Total Protein: 7.3 g/dL (ref 6.5–8.1)

## 2020-04-29 LAB — GLUCOSE, CAPILLARY
Glucose-Capillary: 351 mg/dL — ABNORMAL HIGH (ref 70–99)
Glucose-Capillary: 472 mg/dL — ABNORMAL HIGH (ref 70–99)

## 2020-04-29 LAB — BASIC METABOLIC PANEL
Anion gap: 11 (ref 5–15)
BUN: 23 mg/dL — ABNORMAL HIGH (ref 6–20)
CO2: 28 mmol/L (ref 22–32)
Calcium: 8.3 mg/dL — ABNORMAL LOW (ref 8.9–10.3)
Chloride: 97 mmol/L — ABNORMAL LOW (ref 98–111)
Creatinine, Ser: 1.28 mg/dL — ABNORMAL HIGH (ref 0.44–1.00)
GFR calc Af Amer: 57 mL/min — ABNORMAL LOW (ref >60)
GFR calc non Af Amer: 49 mL/min — ABNORMAL LOW (ref >60)
Glucose, Bld: 385 mg/dL — ABNORMAL HIGH (ref 70–99)
Potassium: 3.7 mmol/L (ref 3.5–5.1)
Sodium: 136 mmol/L (ref 135–145)

## 2020-04-29 LAB — CBC
HCT: 38.7 % (ref 36.0–46.0)
Hemoglobin: 12.8 g/dL (ref 12.0–15.0)
MCH: 31 pg (ref 26.0–34.0)
MCHC: 33.1 g/dL (ref 30.0–36.0)
MCV: 93.7 fL (ref 80.0–100.0)
Platelets: 412 10*3/uL — ABNORMAL HIGH (ref 150–400)
RBC: 4.13 MIL/uL (ref 3.87–5.11)
RDW: 12.9 % (ref 11.5–15.5)
WBC: 13.7 10*3/uL — ABNORMAL HIGH (ref 4.0–10.5)
nRBC: 0 % (ref 0.0–0.2)

## 2020-04-29 LAB — LIPASE, BLOOD: Lipase: 92 U/L — ABNORMAL HIGH (ref 11–51)

## 2020-04-29 MED ORDER — BLOOD GLUCOSE MONITOR KIT: PACK | 0 refills | Status: AC

## 2020-04-29 MED ORDER — DIPHENHYDRAMINE HCL 50 MG/ML IJ SOLN
12.5000 mg | Freq: Once | INTRAMUSCULAR | Status: AC
  Administered 2020-04-29: 12.5 mg via INTRAVENOUS
  Filled 2020-04-29: qty 1

## 2020-04-29 MED ORDER — METOCLOPRAMIDE HCL 5 MG/ML IJ SOLN
5.0000 mg | Freq: Once | INTRAMUSCULAR | Status: AC
  Administered 2020-04-29: 5 mg via INTRAVENOUS
  Filled 2020-04-29: qty 2

## 2020-04-29 MED ORDER — SODIUM CHLORIDE 0.9 % IV BOLUS
1000.0000 mL | Freq: Once | INTRAVENOUS | Status: AC
  Administered 2020-04-29: 1000 mL via INTRAVENOUS

## 2020-04-29 MED ORDER — ONDANSETRON 4 MG PO TBDP: 4.0000 mg | ORAL_TABLET | Freq: Three times a day (TID) | ORAL | 0 refills | Status: DC | PRN

## 2020-04-29 MED ORDER — PROCHLORPERAZINE EDISYLATE 10 MG/2ML IJ SOLN
5.0000 mg | Freq: Once | INTRAMUSCULAR | Status: AC
  Administered 2020-04-29: 5 mg via INTRAVENOUS
  Filled 2020-04-29: qty 2

## 2020-04-29 MED ORDER — IOHEXOL 9 MG/ML PO SOLN
500.0000 mL | Freq: Once | ORAL | Status: DC | PRN
  Administered 2020-04-29: 500 mL via ORAL

## 2020-04-29 MED ORDER — ONDANSETRON HCL 4 MG/2ML IJ SOLN
4.0000 mg | Freq: Once | INTRAMUSCULAR | Status: AC | PRN
  Administered 2020-04-29: 4 mg via INTRAVENOUS
  Filled 2020-04-29: qty 2

## 2020-04-29 MED ORDER — SODIUM CHLORIDE 0.9% FLUSH: 3.0000 mL | Freq: Once | INTRAVENOUS | Status: DC

## 2020-04-29 MED ORDER — KETOROLAC TROMETHAMINE 30 MG/ML IJ SOLN
10.0000 mg | Freq: Once | INTRAMUSCULAR | Status: AC
  Administered 2020-04-29: 9.9 mg via INTRAVENOUS
  Filled 2020-04-29: qty 1

## 2020-04-29 NOTE — ED Provider Notes (Signed)
[facility] Emergency Department Provider Note   ____________________________________________   First MD Initiated Contact with Patient 04/29/20 [phone]     (approximate)  I have reviewed the triage vital signs and the nursing notes.   HISTORY  Chief Complaint Abdominal Pain    HPI Jessica Santiago is a 49 y.o. female with past medical history of hypertension, diabetes, and migraines who presents to the ED complaining of headache and abdominal pain.  Patient reports she "has not been feeling well ever since I was intubated".  Patient was recently admitted for DKA and encephalopathy, was eventually discharged from the hospital 8 days prior.  She reports feeling weak since then and developed diffuse abdominal pain with vomiting and diarrhea a couple of days ago.  This has also been associated with right-sided headache and pain behind her right eye, which she describes as similar to prior headaches.  She has noticed her blood glucose has been running high at home, states she has been compliant with her insulin regimen.  She does admit to eating some sugary foods, including a fruit cup last night.  She denies any fevers, cough, chest pain, shortness of breath, dysuria, or hematuria.  She denies any vision changes, speech changes, numbness, or weakness.        Past Medical History:  Diagnosis Date  . Diabetes mellitus without complication [facility])     Patient Active Problem List   Diagnosis Date Noted  . Acute respiratory failure with hypoxia (HCC) 04/13/2020    No past surgical history on file.  Prior to Admission medications   Medication Sig Start Date End Date Taking? Authorizing Provider  Ensure Max Protein (ENSURE MAX PROTEIN) LIQD Take 330 mLs (11 oz total) by mouth 2 (two) times daily. 04/21/20   Esaw Grandchild A, DO  insulin aspart (NOVOLOG) 100 UNIT/ML injection Inject 6 Units into the skin 3 (three) times daily with meals. 04/21/20   Esaw Grandchild A, DO    insulin glargine (LANTUS) 100 UNIT/ML injection Inject 0.2 mLs (20 Units total) into the skin at bedtime. 04/21/20   Pennie Banter, DO  losartan (COZAAR) 100 MG tablet Take 1 tablet (100 mg total) by mouth daily. 04/22/20   Pennie Banter, DO  Multiple Vitamin (MULTIVITAMIN WITH MINERALS) TABS tablet Take 1 tablet by mouth daily. 04/22/20   Pennie Banter, DO    Allergies Patient has no allergy information on record.  No family history on file.  Social History Social History   Tobacco Use  . Smoking status: Never Smoker  . Smokeless tobacco: Never Used  Substance Use Topics  . Alcohol use: Not on file  . Drug use: Not on file    Review of Systems  Constitutional: No fever/chills Eyes: No visual changes. ENT: No sore throat. Cardiovascular: Denies chest pain. Respiratory: Denies shortness of breath. Gastrointestinal: Positive for abdominal pain.  Positive for nausea, vomiting, and diarrhea.  No constipation. Genitourinary: Negative for dysuria. Musculoskeletal: Negative for back pain. Skin: Negative for rash. Neurological: Negative for headaches, focal weakness or numbness.  ____________________________________________   PHYSICAL EXAM:  VITAL SIGNS: ED Triage Vitals  Enc Vitals Group     BP 04/29/20 0020 (!) 170/87     Pulse Rate 04/29/20 0020 99     Resp 04/29/20 0020 18     Temp 04/29/20 0020 98.4 F (36.9 C)     Temp Source 04/29/20 0020 Oral     SpO2 04/29/20 0020 97 %  Weight 04/29/20 0024 296 lb 11.8 oz (134.6 kg)     Height 04/29/20 0024 5\' 7"  (1.702 m)     Head Circumference --      Peak Flow --      Pain Score 04/29/20 0021 10     Pain Loc --      Pain Edu? --      Excl. in GC? --     Constitutional: Alert and oriented. Eyes: Conjunctivae are normal.  Pupils equal round and reactive to light bilaterally.  Extraocular movements intact. Head: Atraumatic. Nose: No congestion/rhinnorhea. Mouth/Throat: Mucous membranes are moist. Neck:  Normal ROM Cardiovascular: Normal rate, regular rhythm. Grossly normal heart sounds. Respiratory: Normal respiratory effort.  No retractions. Lungs CTAB. Gastrointestinal: Soft and diffusely tender to palpation with no rebound or guarding. No distention. Genitourinary: deferred Musculoskeletal: No lower extremity tenderness nor edema. Neurologic:  Normal speech and language. No gross focal neurologic deficits are appreciated. Skin:  Skin is warm, dry and intact. No rash noted. Psychiatric: Mood and affect are normal. Speech and behavior are normal.  ____________________________________________   LABS (all labs ordered are listed, but only abnormal results are displayed)  Labs Reviewed  GLUCOSE, CAPILLARY - Abnormal; Notable for the following components:      Result Value   Glucose-Capillary 472 (*)    All other components within normal limits  LIPASE, BLOOD - Abnormal; Notable for the following components:   Lipase 92 (*)    All other components within normal limits  COMPREHENSIVE METABOLIC PANEL - Abnormal; Notable for the following components:   Sodium 130 (*)    Potassium 3.4 (*)    Chloride 88 (*)    Glucose, Bld 468 (*)    BUN 25 (*)    Creatinine, Ser 1.42 (*)    Albumin 2.9 (*)    AST 14 (*)    Total Bilirubin 1.3 (*)    GFR calc non Af Amer 44 (*)    GFR calc Af Amer 50 (*)    All other components within normal limits  CBC - Abnormal; Notable for the following components:   WBC 13.7 (*)    Platelets 412 (*)    All other components within normal limits  URINALYSIS, COMPLETE (UACMP) WITH MICROSCOPIC - Abnormal; Notable for the following components:   Color, Urine YELLOW (*)    APPearance HAZY (*)    Glucose, UA >=500 (*)    Ketones, ur 20 (*)    Protein, ur >=300 (*)    All other components within normal limits  GLUCOSE, CAPILLARY - Abnormal; Notable for the following components:   Glucose-Capillary 351 (*)    All other components within normal limits  BASIC  METABOLIC PANEL - Abnormal; Notable for the following components:   Chloride 97 (*)    Glucose, Bld 385 (*)    BUN 23 (*)    Creatinine, Ser 1.28 (*)    Calcium 8.3 (*)    GFR calc non Af Amer 49 (*)    GFR calc Af Amer 57 (*)    All other components within normal limits    PROCEDURES  Procedure(s) performed (including Critical Care):  Procedures   ____________________________________________   INITIAL IMPRESSION / ASSESSMENT AND PLAN / ED COURSE       49 year old female with history of poorly controlled diabetes, hypertension, and migraines presents to the ED complaining of generalized weakness, malaise, vomiting, and diarrhea.  Reviewing her chart from primary care, she has a long history of  poorly controlled diabetes as well as migraine headaches.  Current headache seems improved following migraine cocktail from waiting room, we will check CT head but if this is negative I have low suspicion for [facility].  CT abdomen/pelvis is also negative for acute process and her symptoms sound most consistent with a gastroenteritis given vomiting and diarrhea.  She does not have any fevers and there are no signs of sepsis.  Lab work is remarkable for hyperglycemia as well as AKI, however there is no evidence of DKA given normal anion gap and bicarb.  She received 2 L IV fluid bolus from the waiting room and we will recheck a BMP.  If electrolytes and kidney function are improved, then she would be appropriate for discharge home with PCP follow-up.  She has a PCP appointment scheduled in 4 days.  CT head is negative for acute process, patient reports headache is gradually improving.  Repeat labs following IV fluid hydration shows improvement in kidney function as well as her mild hyponatremia.  At this point, she is appropriate for discharge home with close PCP follow-up, was counseled to continue to drink plenty of fluids and closely monitor her glucose levels.  She was counseled to return to the ED for  new worsening symptoms, patient agrees with plan.  We will prescribe Zofran to assist with her vomiting.      ____________________________________________   FINAL CLINICAL IMPRESSION(S) / ED DIAGNOSES  Final diagnoses:  Hyperglycemia  AKI (acute kidney injury) ([facility])  Generalized abdominal pain  Migraine without status migrainosus, not intractable, unspecified migraine type     ED Discharge Orders    None       Note:  This document was prepared using Dragon voice recognition software and may include unintentional dictation errors.   Blake Divine, MD 04/29/20 1018

## 2020-04-29 NOTE — ED Notes (Signed)
Pt assisted to bathroom by Alissa, NT. Pt vomiting and had episode of diarrhea. Pt c/o migraine HA. EDP aware, see MAR.

## 2020-04-29 NOTE — ED Triage Notes (Addendum)
Pt from home via [facility] with c/o abdominal pain, emesis, diarrhea, eye pain behind R eye "feels like a migraine". Pt c/o weakness and "not feeling well" since pt was "intubated last week".  Pt c/o weakness. CBG: 472 checked by this RN.  NAD noted.

## 2020-05-02 MED ORDER — LOSARTAN POTASSIUM 50 MG PO TABS
100.00 | ORAL_TABLET | ORAL | Status: DC
Start: 2020-05-03 — End: 2020-05-02

## 2020-05-02 MED ORDER — HYDRALAZINE HCL 25 MG PO TABS
25.00 | ORAL_TABLET | ORAL | Status: DC
Start: ? — End: 2020-05-02

## 2020-05-02 MED ORDER — GENERIC EXTERNAL MEDICATION
12.50 | Status: DC
Start: ? — End: 2020-05-02

## 2020-05-02 MED ORDER — HYDROCHLOROTHIAZIDE 25 MG PO TABS
25.00 | ORAL_TABLET | ORAL | Status: DC
Start: 2020-05-03 — End: 2020-05-02

## 2020-05-02 MED ORDER — INSULIN GLARGINE 100 UNIT/ML ~~LOC~~ SOLN
20.00 | SUBCUTANEOUS | Status: DC
Start: 2020-05-02 — End: 2020-05-02

## 2020-05-02 MED ORDER — CARVEDILOL 6.25 MG PO TABS
6.25 | ORAL_TABLET | ORAL | Status: DC
Start: 2020-05-02 — End: 2020-05-02

## 2020-05-02 MED ORDER — ASPIRIN 81 MG PO TBEC
81.00 | DELAYED_RELEASE_TABLET | ORAL | Status: DC
Start: 2020-05-03 — End: 2020-05-02

## 2020-05-02 MED ORDER — ONDANSETRON HCL 4 MG/2ML IJ SOLN
4.00 | INTRAMUSCULAR | Status: DC
Start: ? — End: 2020-05-02

## 2020-05-02 MED ORDER — ATORVASTATIN CALCIUM 40 MG PO TABS
40.00 | ORAL_TABLET | ORAL | Status: DC
Start: 2020-05-02 — End: 2020-05-02

## 2020-05-02 MED ORDER — SENNOSIDES-DOCUSATE SODIUM 8.6-50 MG PO TABS
2.00 | ORAL_TABLET | ORAL | Status: DC
Start: 2020-05-02 — End: 2020-05-02

## 2020-05-02 MED ORDER — ALUM & MAG HYDROXIDE-SIMETH 400-400-40 MG/5ML PO SUSP
15.00 | ORAL | Status: DC
Start: ? — End: 2020-05-02

## 2020-05-02 MED ORDER — GLUCAGON (RDNA) 1 MG IJ KIT
1.00 | PACK | INTRAMUSCULAR | Status: DC
Start: ? — End: 2020-05-02

## 2020-05-02 MED ORDER — INSULIN LISPRO 100 UNIT/ML ~~LOC~~ SOLN
5.00 | SUBCUTANEOUS | Status: DC
Start: 2020-05-02 — End: 2020-05-02

## 2020-05-02 MED ORDER — PANTOPRAZOLE SODIUM 40 MG PO TBEC
40.00 | DELAYED_RELEASE_TABLET | ORAL | Status: DC
Start: 2020-05-03 — End: 2020-05-02

## 2020-05-02 MED ORDER — INSULIN LISPRO 100 UNIT/ML ~~LOC~~ SOLN
0.00 | SUBCUTANEOUS | Status: DC
Start: 2020-05-02 — End: 2020-05-02

## 2020-05-02 MED ORDER — CARVEDILOL 12.5 MG PO TABS
12.50 | ORAL_TABLET | ORAL | Status: DC
Start: 2020-05-02 — End: 2020-05-02

## 2020-05-02 MED ORDER — METOCLOPRAMIDE HCL 5 MG/ML IJ SOLN
5.00 | INTRAMUSCULAR | Status: DC
Start: 2020-05-02 — End: 2020-05-02

## 2020-05-02 MED ORDER — LIDOCAINE HCL 1 % IJ SOLN
0.50 | INTRAMUSCULAR | Status: DC
Start: ? — End: 2020-05-02

## 2020-05-02 MED ORDER — ENOXAPARIN SODIUM 40 MG/0.4ML ~~LOC~~ SOLN
40.00 | SUBCUTANEOUS | Status: DC
Start: 2020-05-03 — End: 2020-05-02

## 2020-05-02 MED ORDER — ACETAMINOPHEN 325 MG PO TABS
975.00 | ORAL_TABLET | ORAL | Status: DC
Start: ? — End: 2020-05-02

## 2020-05-02 MED ORDER — DEXTROSE 50 % IV SOLN
12.50 | INTRAVENOUS | Status: DC
Start: ? — End: 2020-05-02

## 2020-05-05 LAB — CULTURE, FUNGUS WITHOUT SMEAR

## 2021-10-05 IMAGING — DX DG ABDOMEN 1V
1 series · 1 of 1 positions shown · non-contrast
Comparison: None.

CLINICAL DATA: Orogastric tube placement

EXAM:
ABDOMEN - 1 VIEW

[abdomen supine]
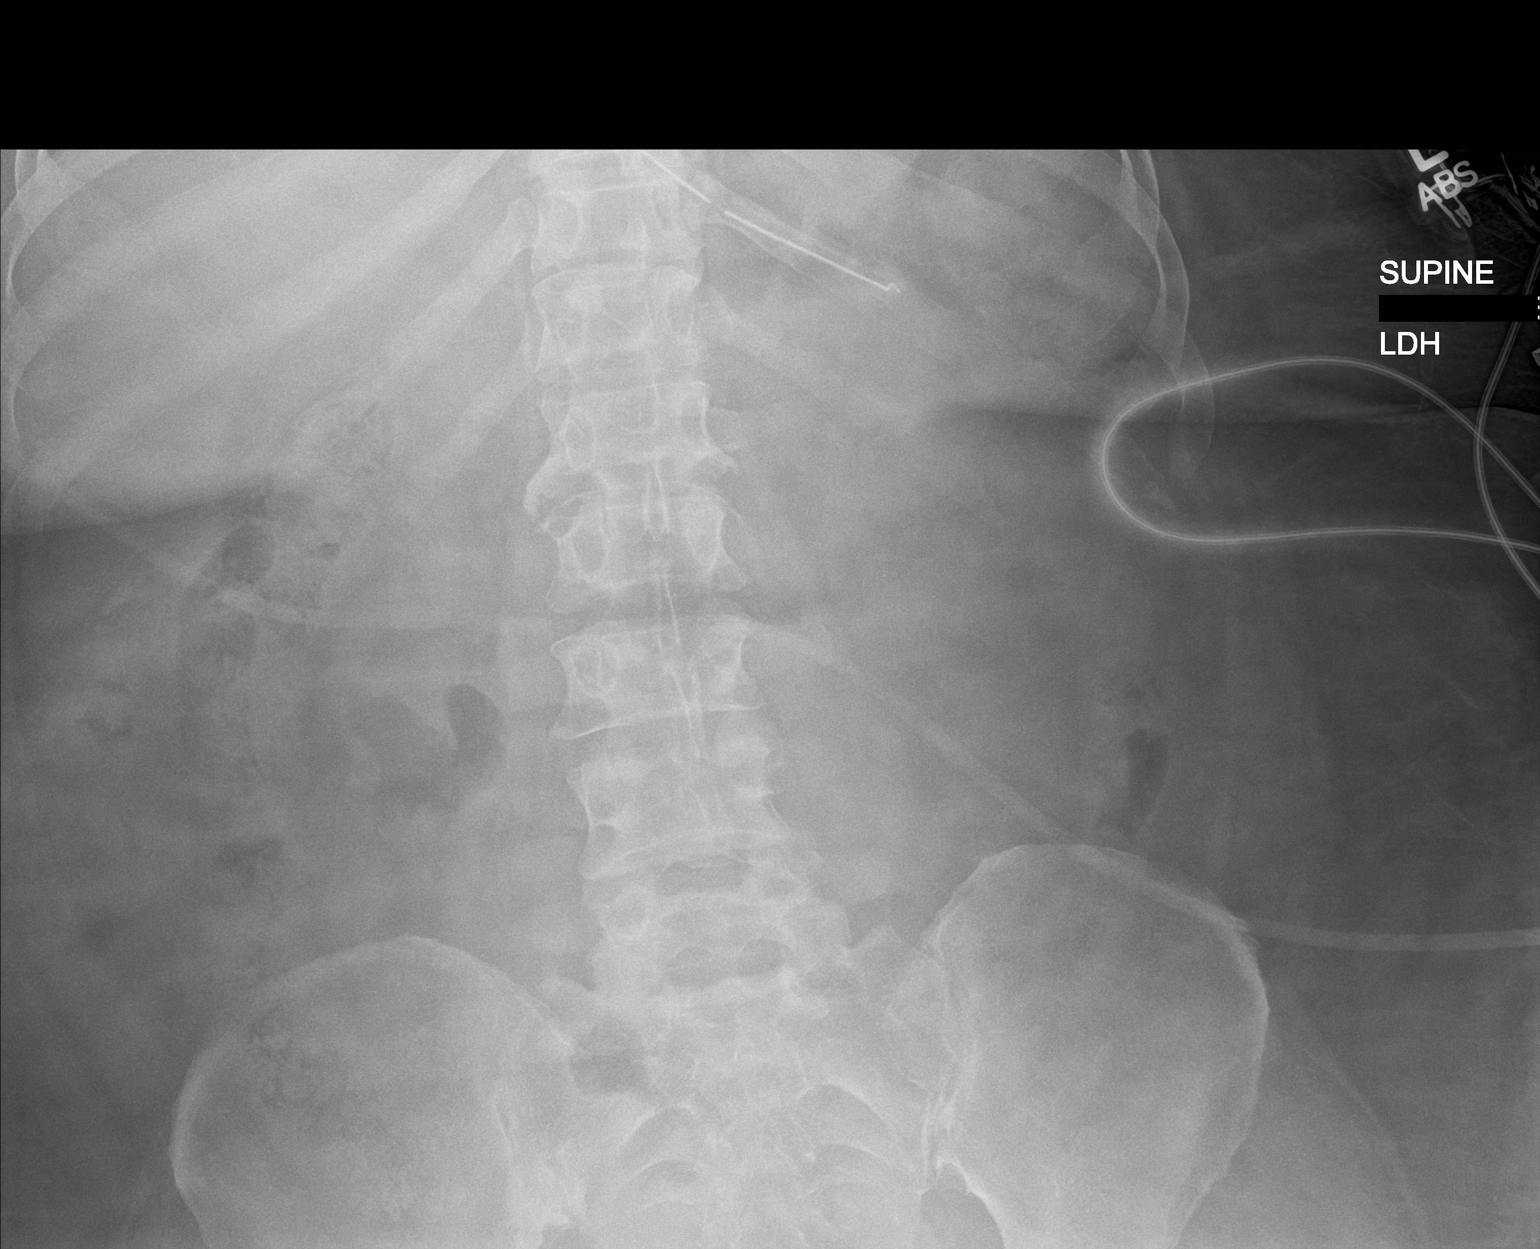

[1 of 1 positions shown; findings below may reference images not displayed]

FINDINGS: Orogastric tube tip and side port in stomach. Moderate stool in
colon. No bowel dilatation or air-fluid level to suggest bowel
obstruction. No free air.
IMPRESSION: Orogastric tube tip and side port in stomach. No bowel obstruction
or free air evident on supine examination.

## 2021-10-05 IMAGING — DX DG CHEST 1V PORT
1 series · 1 of 1 positions shown · non-contrast
Comparison: None.

CLINICAL DATA: Hypoxia and confusion

EXAM:
PORTABLE CHEST 1 VIEW

[chest ap]
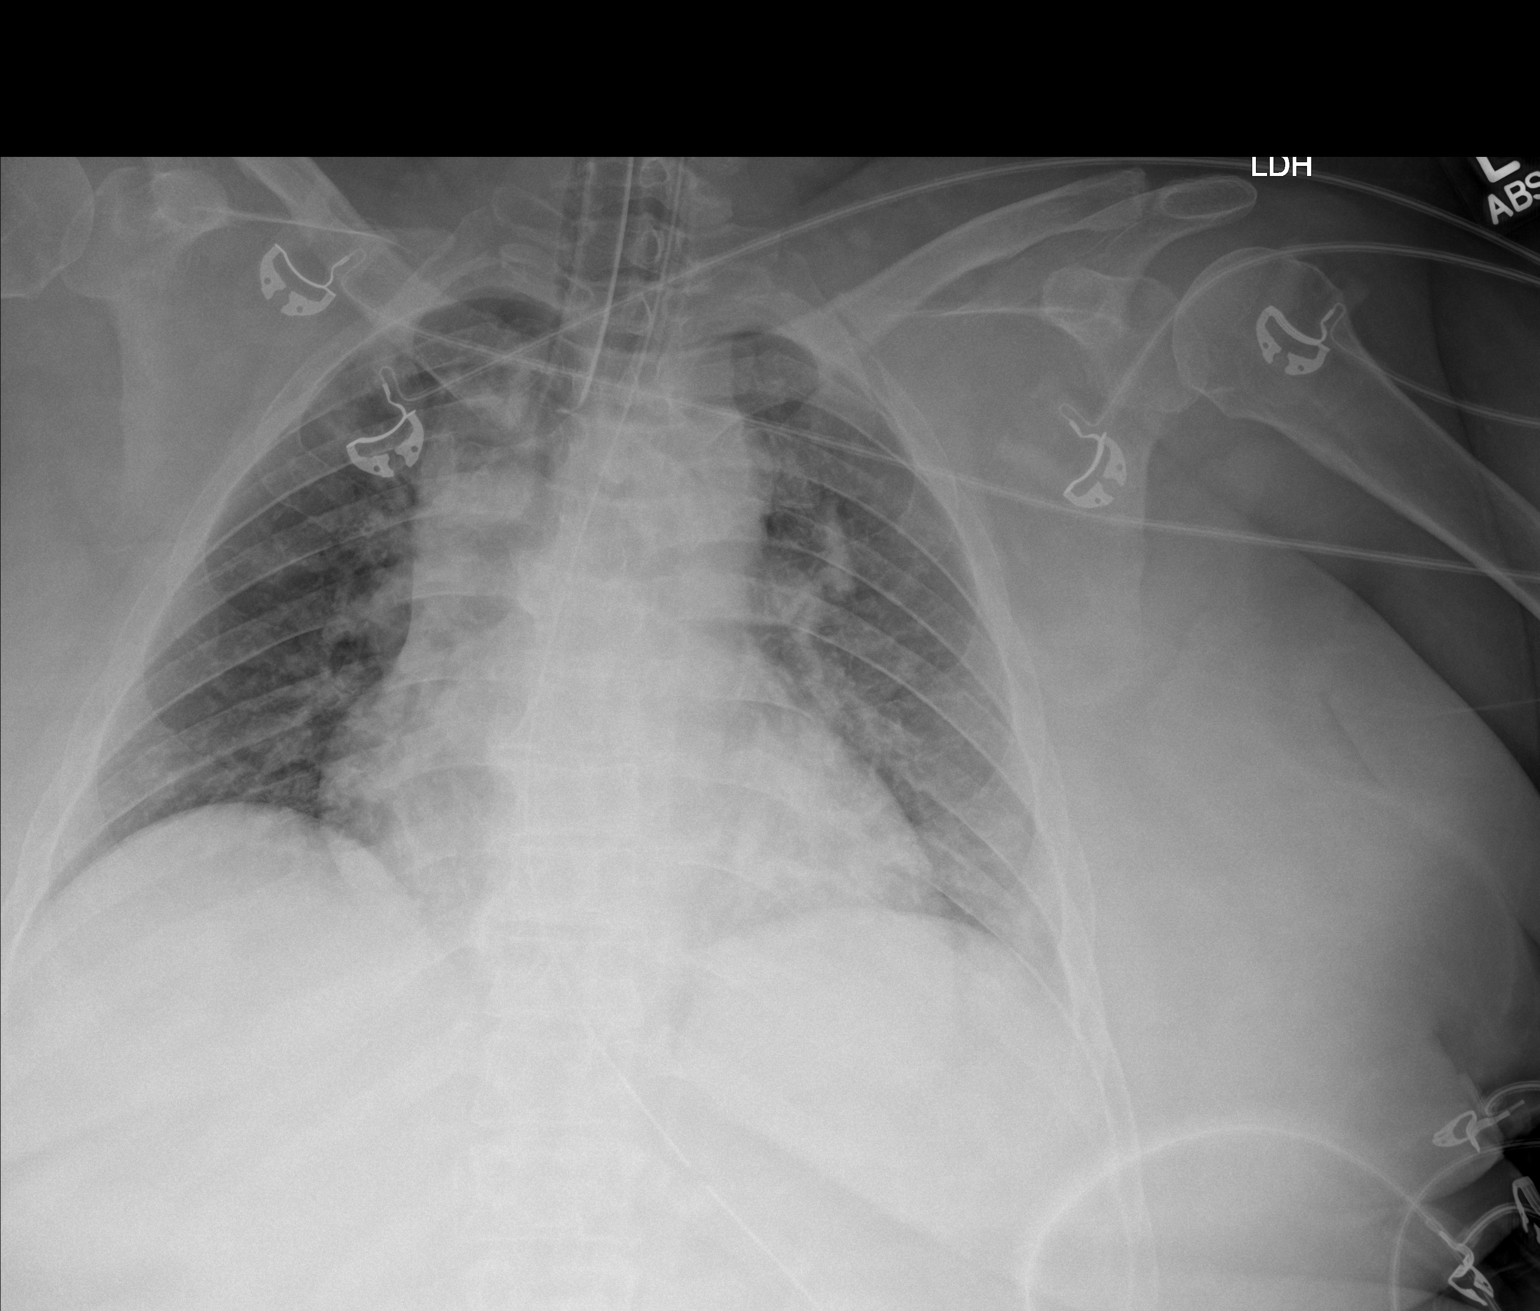

[1 of 1 positions shown; findings below may reference images not displayed]

FINDINGS: Endotracheal tube tip is 3.2 cm above the carina. Nasogastric tube
tip and side port are below the diaphragm. Side port is seen in the
stomach. No pneumothorax. There is left base atelectasis. Lungs
elsewhere clear. Heart is slightly enlarged with pulmonary
vascularity normal. No adenopathy. No bone lesions.
IMPRESSION: Tube positions as described without pneumothorax. Left base
atelectasis. Lungs elsewhere clear. Heart prominent.

## 2021-10-06 IMAGING — DX DG CHEST 1V PORT
1 series · 1 of 1 positions shown · non-contrast
Comparison: 04/13/2020

CLINICAL DATA: Central line placement, intubated

EXAM:
PORTABLE CHEST 1 VIEW

[chest ap]
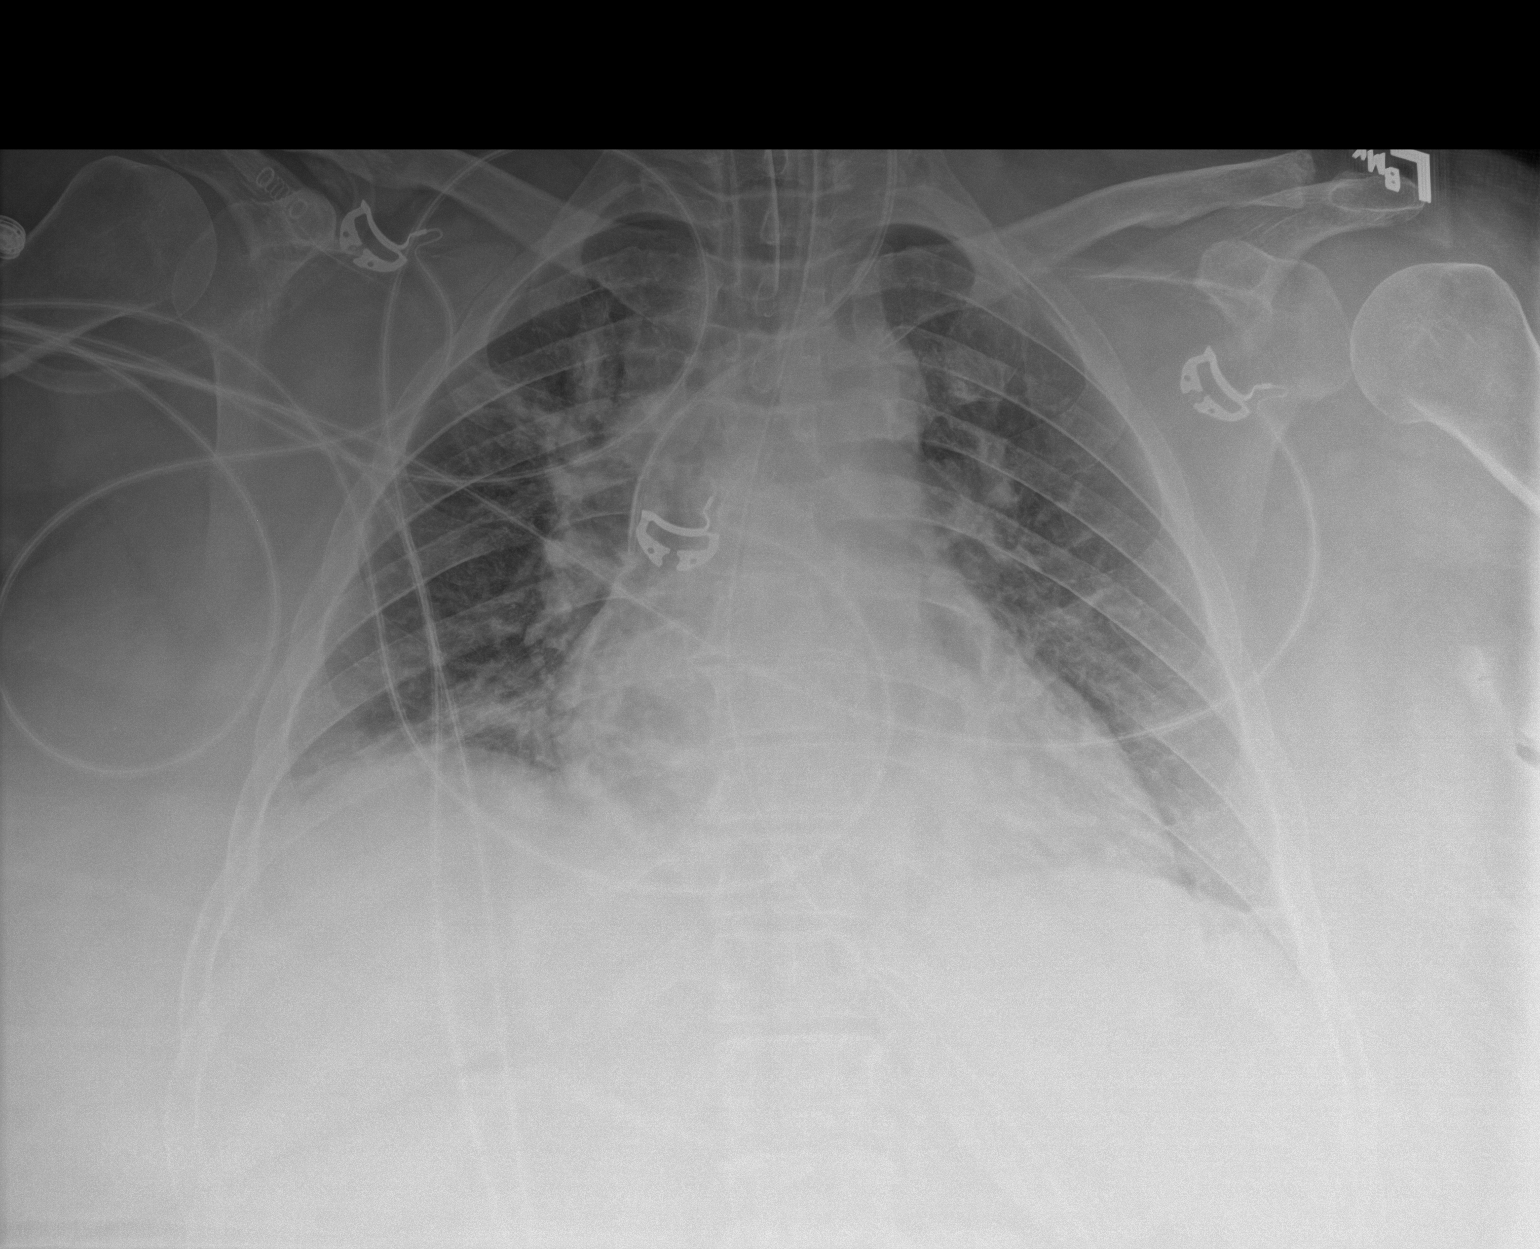

[1 of 1 positions shown; findings below may reference images not displayed]

FINDINGS: Single frontal view of the chest demonstrates stable endotracheal
and enteric catheters. Left internal jugular catheter tip projects
over superior vena cava. Cardiac silhouette is stable. There is
persistent central vascular congestion, with developing patchy
bilateral airspace disease. No effusion or pneumothorax. No acute
bony abnormalities.
IMPRESSION: 1. Support devices as above.
2. Progressive central vascular congestion, with developing patchy
bilateral airspace disease. Findings could reflect edema or
infection.

## 2021-10-10 IMAGING — DX DG CHEST 1V PORT
1 series · 1 of 1 positions shown · non-contrast
Comparison: April 14, 2020

CLINICAL DATA: Status postextubation

EXAM:
PORTABLE CHEST 1 VIEW

[chest ap]
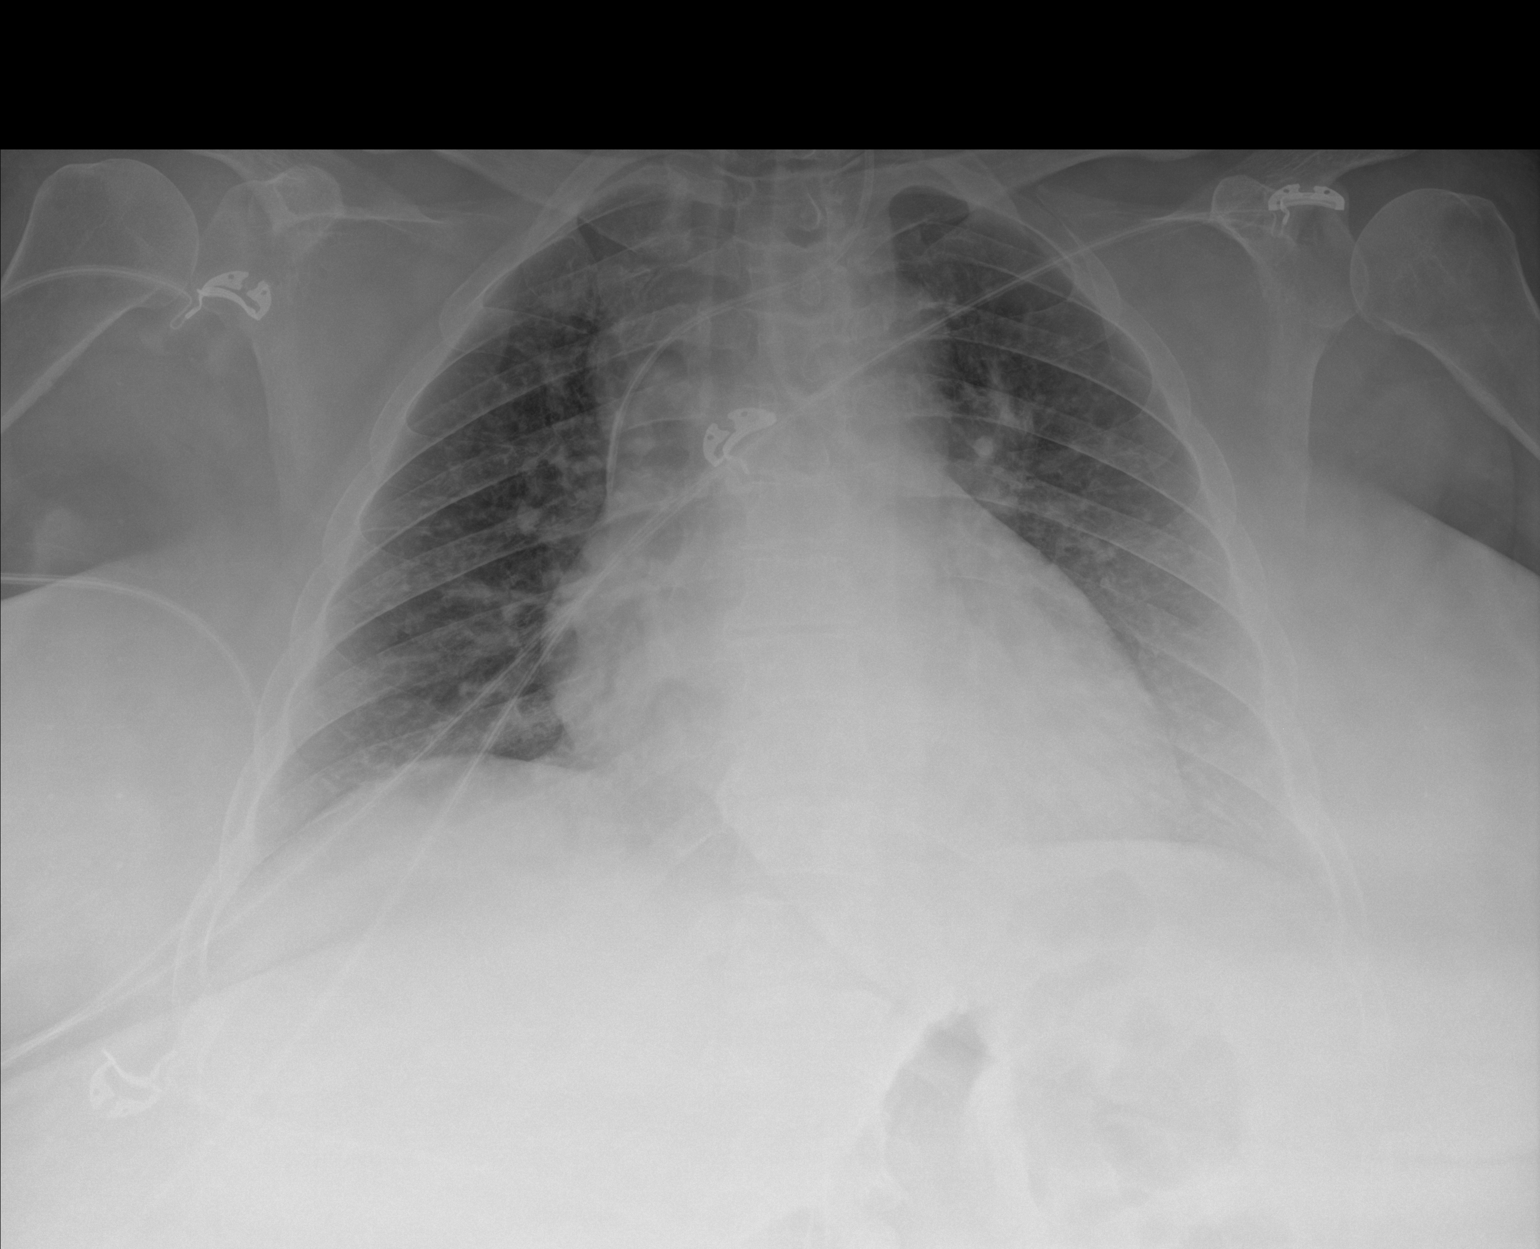

[1 of 1 positions shown; findings below may reference images not displayed]

FINDINGS: Endotracheal tube and nasogastric tube have been removed. Central
catheter tip is in the superior vena cava. No pneumothorax. Lungs
are now clear. Heart is borderline enlarged with pulmonary
vascularity normal. No adenopathy. No bone lesions.
IMPRESSION: Central catheter tip in superior vena cava. No pneumothorax. Lungs
clear. Borderline cardiomegaly.

## 2021-10-21 IMAGING — CT CT HEAD W/O CM
3 series · 15 of 46 positions shown, 18 images · non-contrast
Comparison: 04/13/2020

CLINICAL DATA: Headache

EXAM:
CT HEAD WITHOUT CONTRAST
TECHNIQUE: Contiguous axial images were obtained from the base of the skull
through the vertex without intravenous contrast.

[Series 2: head wo · axial · 0.47mm/px · z∈[-119,+1]mm · 9 of 29 slices shown, 12 images]
[im 3/29  brain]
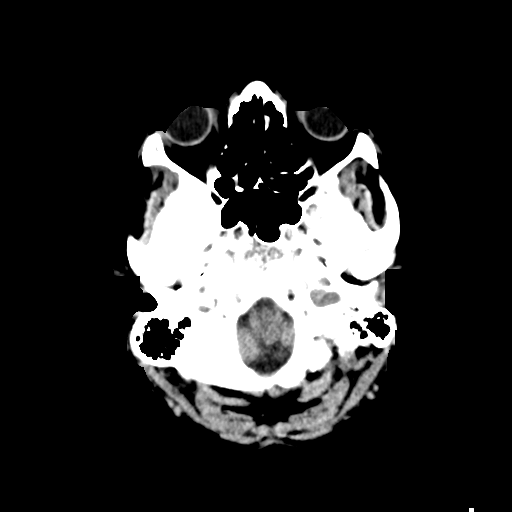
[im 3/29  bone]
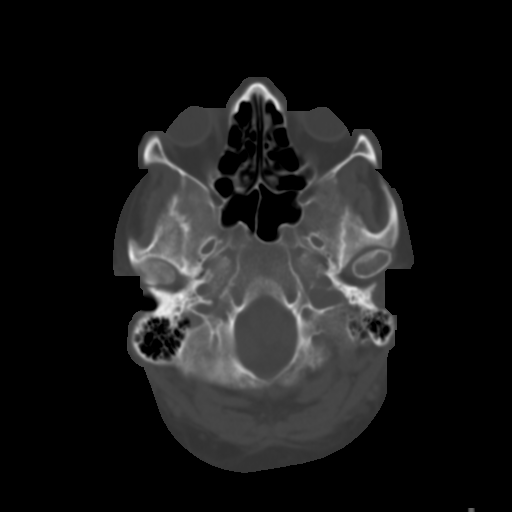
[im 6/29  brain]
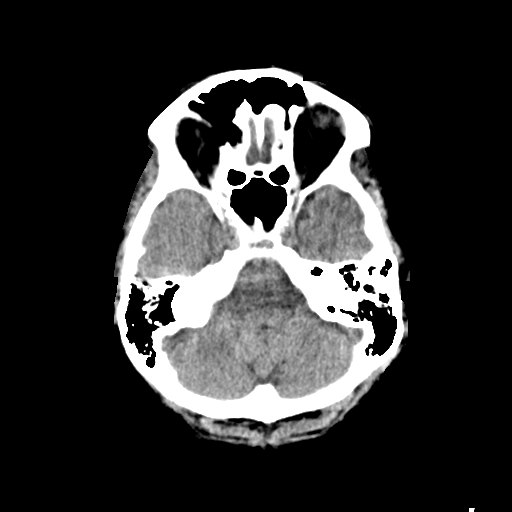
[im 9/29  brain]
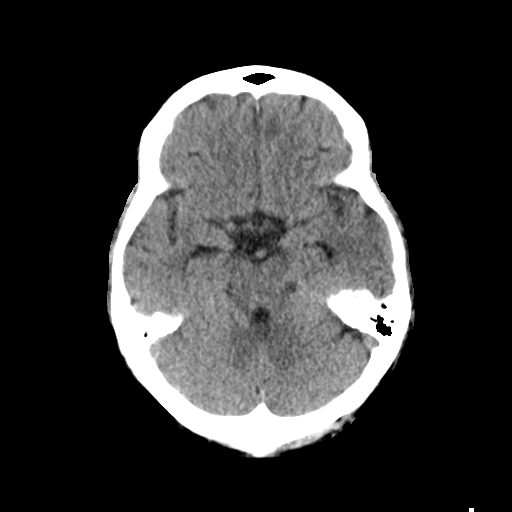
[im 12/29  brain]
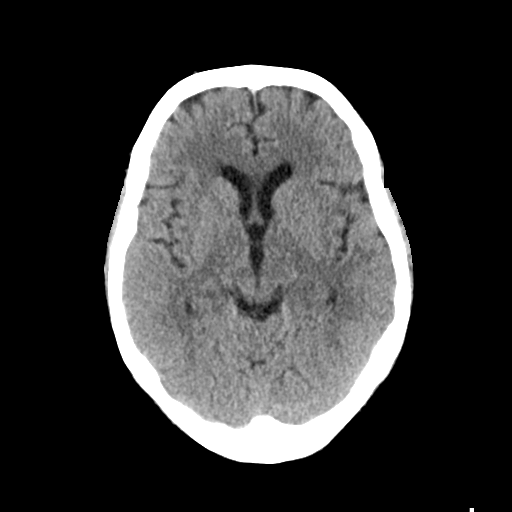
[im 15/29  brain]
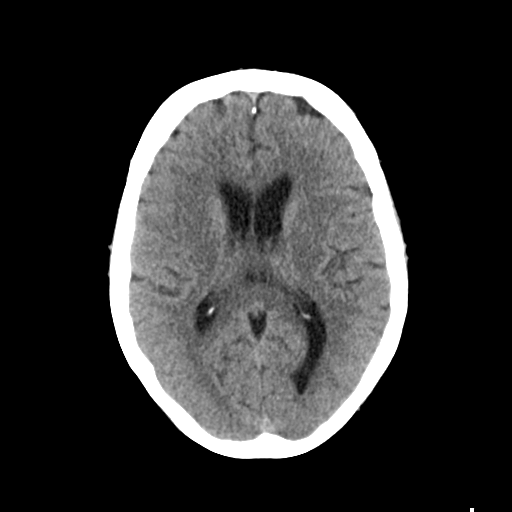
[im 15/29  bone]
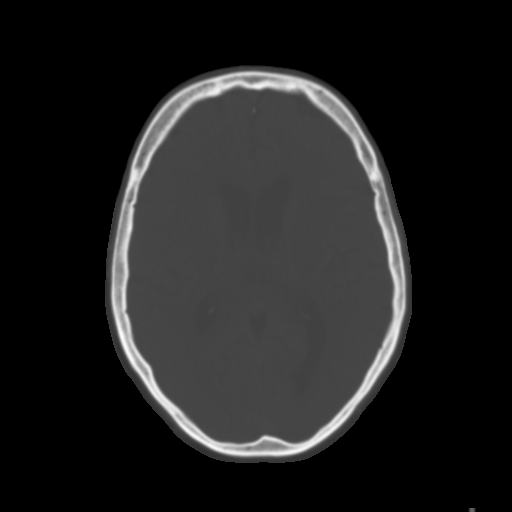
[im 18/29  brain]
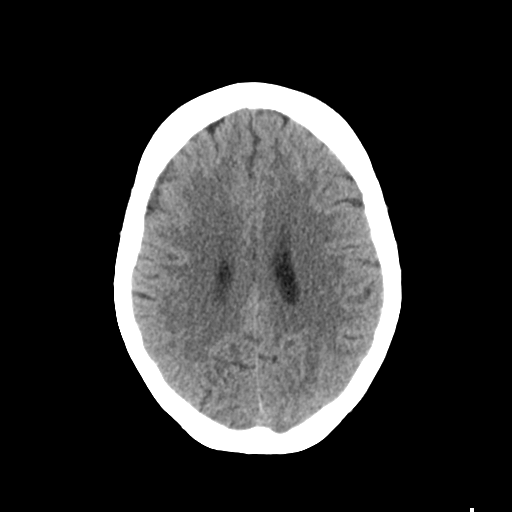
[im 21/29  brain]
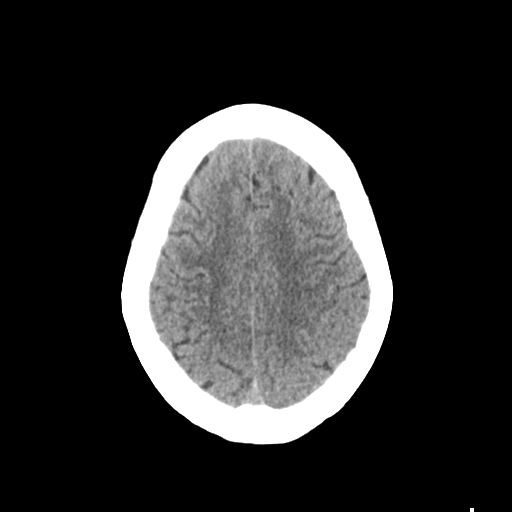
[im 24/29  brain]
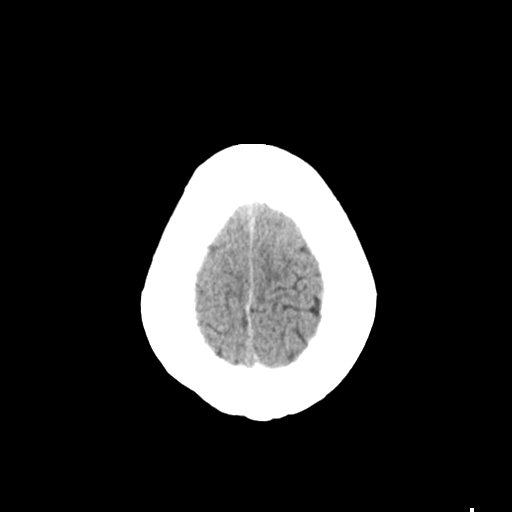
[im 27/29  brain]
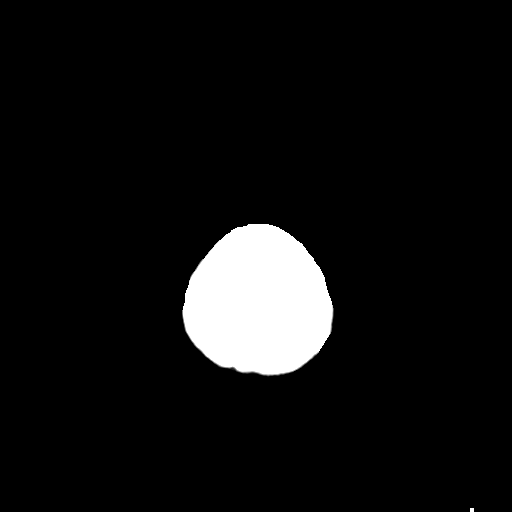
[im 27/29  bone]
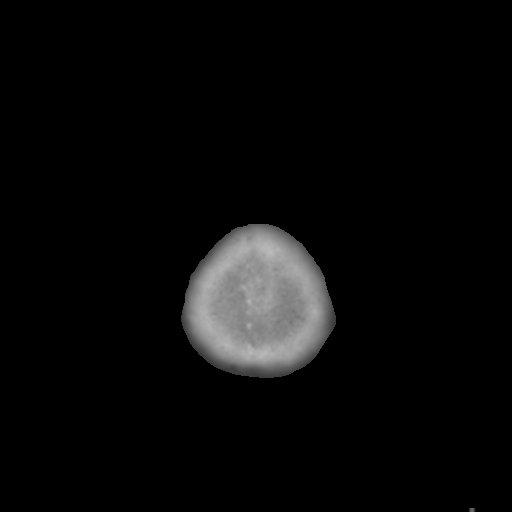

[Series 4: coronal soft tissue · coronal · 0.30mm/px · 3 of 61 slices shown]
[im 21/61  brain]
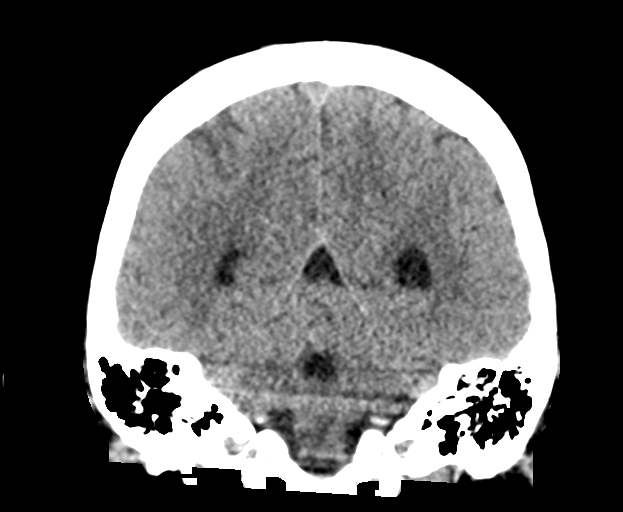
[im 27/61  brain]
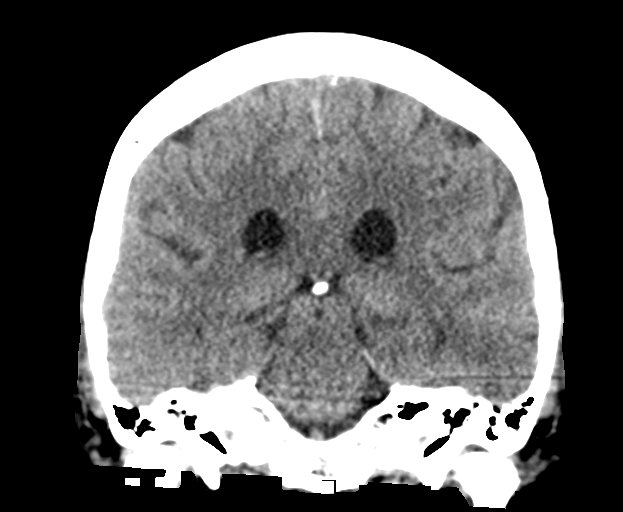
[im 34/61  brain]
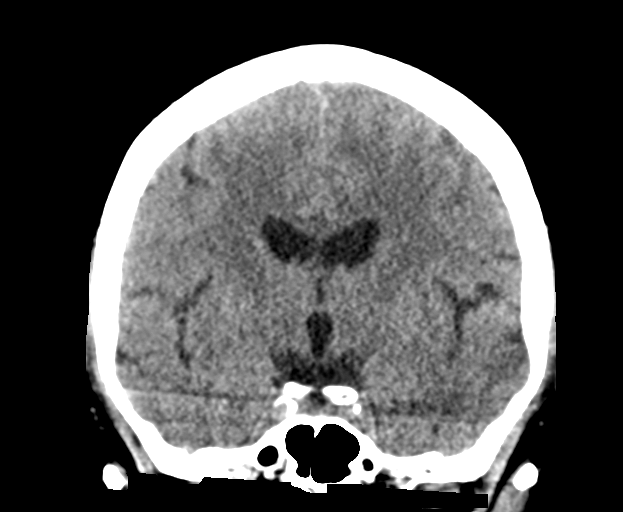

[Series 5: sagittal soft tissue · sagittal · 0.28mm/px · 3 of 50 slices shown]
[im 17/50  brain]
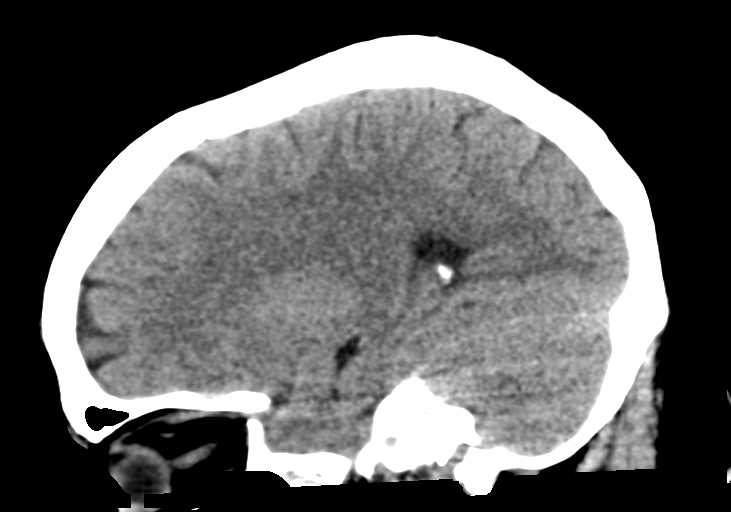
[im 25/50  brain]
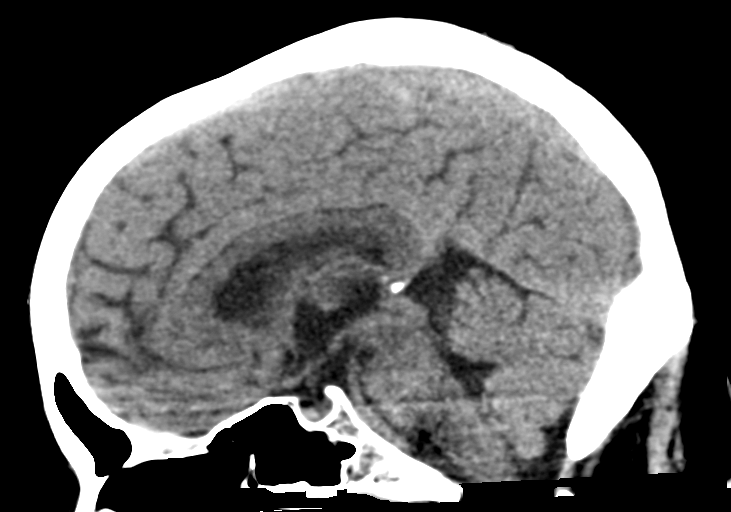
[im 33/50  brain]
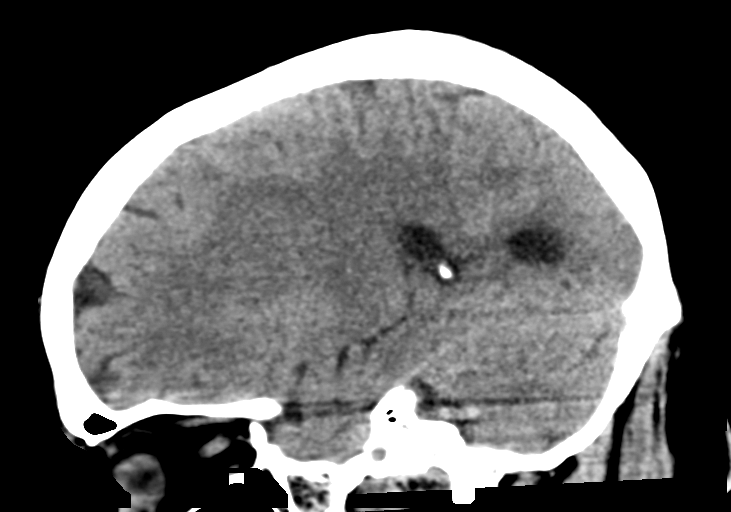

[15 of 46 positions shown; findings below may reference images not displayed]

FINDINGS: Brain: There is no acute intracranial hemorrhage, mass effect, or
edema. Gray-white differentiation is preserved. There is no
extra-axial fluid collection. Ventricles and sulci are within normal
limits in size and configuration.

Vascular: There is mild intracranial atherosclerotic calcification
at the skull base.

Skull: Calvarium is unremarkable.

Sinuses/Orbits: No acute finding.

Other: None.
IMPRESSION: No acute intracranial abnormality.

## 2022-09-25 LAB — HSV DNA BY PCR (REFERENCE LAB)
HSV 1 DNA: NEGATIVE
HSV 1 DNA: NEGATIVE
HSV 2 DNA: NEGATIVE
HSV 2 DNA: NEGATIVE

## 2023-05-12 ENCOUNTER — Other Ambulatory Visit: Payer: Self-pay

## 2023-05-12 ENCOUNTER — Emergency Department: Payer: Medicaid Other

## 2023-05-12 ENCOUNTER — Emergency Department
Admission: EM | Admit: 2023-05-12 | Discharge: 2023-05-12 | Disposition: A | Discharge status: Home / Self Care | Payer: Medicaid Other | Attending: Emergency Medicine | Admitting: Emergency Medicine

## 2023-05-12 DIAGNOSIS — I129 Hypertensive chronic kidney disease with stage 1 through stage 4 chronic kidney disease, or unspecified chronic kidney disease: Secondary | ICD-10-CM | POA: Diagnosis not present

## 2023-05-12 DIAGNOSIS — R1013 Epigastric pain: Secondary | ICD-10-CM | POA: Insufficient documentation

## 2023-05-12 DIAGNOSIS — R112 Nausea with vomiting, unspecified: Secondary | ICD-10-CM | POA: Insufficient documentation

## 2023-05-12 DIAGNOSIS — N189 Chronic kidney disease, unspecified: Secondary | ICD-10-CM | POA: Diagnosis not present

## 2023-05-12 DIAGNOSIS — E1122 Type 2 diabetes mellitus with diabetic chronic kidney disease: Secondary | ICD-10-CM | POA: Insufficient documentation

## 2023-05-12 DIAGNOSIS — R1010 Upper abdominal pain, unspecified: Secondary | ICD-10-CM | POA: Diagnosis present

## 2023-05-12 LAB — CBC WITH DIFFERENTIAL/PLATELET
Abs Immature Granulocytes: 0.04 10*3/uL (ref 0.00–0.07)
Basophils Absolute: 0 10*3/uL (ref 0.0–0.1)
Basophils Relative: 0 %
Eosinophils Absolute: 0.2 10*3/uL (ref 0.0–0.5)
Eosinophils Relative: 2 %
HCT: 39.1 % (ref 36.0–46.0)
Hemoglobin: 12.6 g/dL (ref 12.0–15.0)
Immature Granulocytes: 1 %
Lymphocytes Relative: 15 %
Lymphs Abs: 1.3 10*3/uL (ref 0.7–4.0)
MCH: 28.8 pg (ref 26.0–34.0)
MCHC: 32.2 g/dL (ref 30.0–36.0)
MCV: 89.3 fL (ref 80.0–100.0)
Monocytes Absolute: 0.5 10*3/uL (ref 0.1–1.0)
Monocytes Relative: 6 %
Neutro Abs: 6.3 10*3/uL (ref 1.7–7.7)
Neutrophils Relative %: 76 %
Platelets: 296 10*3/uL (ref 150–400)
RBC: 4.38 MIL/uL (ref 3.87–5.11)
RDW: 13.3 % (ref 11.5–15.5)
WBC: 8.3 10*3/uL (ref 4.0–10.5)
nRBC: 0 % (ref 0.0–0.2)

## 2023-05-12 LAB — COMPREHENSIVE METABOLIC PANEL
ALT: 24 U/L (ref 0–44)
AST: 24 U/L (ref 15–41)
Albumin: 3.3 g/dL — ABNORMAL LOW (ref 3.5–5.0)
Alkaline Phosphatase: 78 U/L (ref 38–126)
Anion gap: 13 (ref 5–15)
BUN: 38 mg/dL — ABNORMAL HIGH (ref 6–20)
CO2: 22 mmol/L (ref 22–32)
Calcium: 8.7 mg/dL — ABNORMAL LOW (ref 8.9–10.3)
Chloride: 103 mmol/L (ref 98–111)
Creatinine, Ser: 2.34 mg/dL — ABNORMAL HIGH (ref 0.44–1.00)
GFR, Estimated: 25 mL/min — ABNORMAL LOW (ref >60)
Glucose, Bld: 221 mg/dL — ABNORMAL HIGH (ref 70–99)
Potassium: 3.7 mmol/L (ref 3.5–5.1)
Sodium: 138 mmol/L (ref 135–145)
Total Bilirubin: 1.4 mg/dL — ABNORMAL HIGH (ref 0.3–1.2)
Total Protein: 7.2 g/dL (ref 6.5–8.1)

## 2023-05-12 LAB — LIPASE, BLOOD: Lipase: 35 U/L (ref 11–51)

## 2023-05-12 MED ORDER — MORPHINE SULFATE (PF) 4 MG/ML IV SOLN
4.0000 mg | Freq: Once | INTRAVENOUS | Status: AC
  Administered 2023-05-12: 4 mg via INTRAVENOUS
  Filled 2023-05-12: qty 1

## 2023-05-12 MED ORDER — ONDANSETRON 4 MG PO TBDP: 4.0000 mg | ORAL_TABLET | Freq: Three times a day (TID) | ORAL | 0 refills | Status: AC | PRN

## 2023-05-12 MED ORDER — ONDANSETRON HCL 4 MG/2ML IJ SOLN
4.0000 mg | Freq: Once | INTRAMUSCULAR | Status: AC
  Administered 2023-05-12: 4 mg via INTRAVENOUS
  Filled 2023-05-12: qty 2

## 2023-05-12 MED ORDER — DROPERIDOL 2.5 MG/ML IJ SOLN
2.5000 mg | Freq: Once | INTRAMUSCULAR | Status: AC
  Administered 2023-05-12: 2.5 mg via INTRAVENOUS
  Filled 2023-05-12: qty 2

## 2023-05-12 MED ORDER — ALUM & MAG HYDROXIDE-SIMETH 200-200-20 MG/5ML PO SUSP
30.0000 mL | Freq: Once | ORAL | Status: AC
  Administered 2023-05-12: 30 mL via ORAL
  Filled 2023-05-12: qty 30

## 2023-05-12 MED ORDER — LIDOCAINE VISCOUS HCL 2 % MT SOLN
15.0000 mL | Freq: Once | OROMUCOSAL | Status: AC
  Administered 2023-05-12: 15 mL via ORAL
  Filled 2023-05-12: qty 15

## 2023-05-12 NOTE — ED Notes (Signed)
Pt verbalizes understanding of discharge instructions. Opportunity for questioning and answers were provided. Pt discharged from ED to home with son.    

## 2023-05-12 NOTE — ED Notes (Signed)
MD notified of pt's BP, pain and that she is actively vomiting at this time.

## 2023-05-12 NOTE — ED Provider Notes (Signed)
[facility] Provider Note    Event Date/Time   First MD Initiated Contact with Patient 05/12/23 1918     (approximate)   History   Chief Complaint Abdominal Pain  HPI  Jessica Santiago is a 52 y.o. female with past medical history of diabetes, CKD, and hypertension who presents to the ED complaining of abdominal pain.  Patient reports that she has been dealing with consistent pain in her upper abdomen for about the past 24 hours.  This has been associated with nausea and vomiting to the point that she has been unable to keep anything down.  She denies any associated diarrhea or constipation, has not had any dysuria, fever, or flank pain.  Patient states she was seen in the ED for similar symptoms at [facility] 4 days ago, had unremarkable CT scan at that time and was diagnosed with gastritis.  She was prescribed omeprazole and Carafate, states she has been taking these medications without relief.  She denies any pain in her chest or difficulty breathing.     Physical Exam   Triage Vital Signs: ED Triage Vitals [05/12/23 1851]  Enc Vitals Group     BP (!) 200/98     Pulse Rate 83     Resp 20     Temp 97.7 F (36.5 C)     Temp Source Oral     SpO2 100 %     Weight 295 lb 6.7 oz (134 kg)     Height 5\' 7"  (1.702 m)     Head Circumference      Peak Flow      Pain Score 10     Pain Loc      Pain Edu?      Excl. in GC?     Most recent vital signs: Vitals:   05/12/23 2130 05/12/23 2230  BP: (!) 197/98 (!) 192/95  Pulse: 90 93  Resp: 18 17  Temp:    SpO2: 94% 94%    Constitutional: Alert and oriented. Eyes: Conjunctivae are normal. Head: Atraumatic. Nose: No congestion/rhinnorhea. Mouth/Throat: Mucous membranes are moist.  Cardiovascular: Normal rate, regular rhythm. Grossly normal heart sounds.  2+ radial pulses bilaterally. Respiratory: Normal respiratory effort.  No retractions. Lungs CTAB. Gastrointestinal: Soft and tender to palpation in the  epigastrium with no rebound or guarding. No distention. Musculoskeletal: No lower extremity tenderness nor edema.  Neurologic:  Normal speech and language. No gross focal neurologic deficits are appreciated.    ED Results / Procedures / Treatments   Labs (all labs ordered are listed, but only abnormal results are displayed) Labs Reviewed  COMPREHENSIVE METABOLIC PANEL - Abnormal; Notable for the following components:      Result Value   Glucose, Bld 221 (*)    BUN 38 (*)    Creatinine, Ser 2.34 (*)    Calcium 8.7 (*)    Albumin 3.3 (*)    Total Bilirubin 1.4 (*)    GFR, Estimated 25 (*)    All other components within normal limits  LIPASE, BLOOD  CBC WITH DIFFERENTIAL/PLATELET  URINALYSIS, ROUTINE W REFLEX MICROSCOPIC  POC URINE PREG, ED     EKG  ED ECG REPORT I, Chesley Noon, the attending physician, personally viewed and interpreted this ECG.   Date: 05/12/2023  EKG Time: 19:11  Rate: 87  Rhythm: normal sinus rhythm  Axis: LAD  Intervals:none  ST&T Change: None  RADIOLOGY Right upper quadrant ultrasound reviewed and interpreted by me with no gallstones, gallbladder  wall thickening, or pericholecystic fluid.  PROCEDURES:  Critical Care performed: No  Procedures   MEDICATIONS ORDERED IN ED: Medications  morphine (PF) 4 MG/ML injection 4 mg (4 mg Intravenous Given 05/12/23 1926)  ondansetron (ZOFRAN) injection 4 mg (4 mg Intravenous Given 05/12/23 1926)  droperidol (INAPSINE) 2.5 MG/ML injection 2.5 mg (2.5 mg Intravenous Given 05/12/23 1959)  alum & mag hydroxide-simeth (MAALOX/MYLANTA) 200-200-20 MG/5ML suspension 30 mL (30 mLs Oral Given 05/12/23 2146)    And  lidocaine (XYLOCAINE) 2 % viscous mouth solution 15 mL (15 mLs Oral Given 05/12/23 2146)     IMPRESSION / MDM / ASSESSMENT AND PLAN / ED COURSE  I reviewed the triage vital signs and the nursing notes.                              52 y.o. female with past medical history of hypertension,  diabetes, and CKD who presents to the ED complaining of increasing pain in her epigastric area associated with intractable nausea and vomiting for the past 24 hours.  Patient's presentation is most consistent with acute presentation with potential threat to life or bodily function.  Differential diagnosis includes, but is not limited to, pancreatitis, hepatitis, biliary colic, cholecystitis, gastritis, dehydration, electrolyte abnormality, AKI, DKA.  Patient uncomfortable appearing but in no acute distress, vital signs remarkable for elevated blood pressure but otherwise reassuring.  Abdomen is soft but she does have some tenderness in her epigastrium, CT imaging reviewed from [facility] 4 days ago and was remarkable only for thickening of the distal esophagus.  Do not feel repeat CT indicated at this time, but will check right upper quadrant ultrasound for biliary pathology.  Labs without significant anemia, leukocytosis, showed abnormality, or AKI.  Patient does have AKI compared to 4 days ago with some hyperglycemia but no evidence of DKA.  LFTs and lipase are unremarkable.  Patient with ongoing symptoms despite IV morphine and Zofran, will treat with IV droperidol.  Nausea improved following dose of IV droperidol, patient continues to complain of burning abdominal pain, but had some improvement with a GI cocktail.  She is tolerating oral intake without difficulty and appropriate for outpatient management for suspected gastritis/esophagitis.  She was previously prescribed PPI and Carafate by [facility], counseled to continue these medications and we will prescribe Zofran.  She was counseled to return to the ED for new or worsening symptoms, also to schedule follow-up with GI.  Patient agrees with plan.      FINAL CLINICAL IMPRESSION(S) / ED DIAGNOSES   Final diagnoses:  Epigastric pain  Nausea and vomiting, unspecified vomiting type     Rx / DC Orders   ED Discharge Orders          Ordered     ondansetron (ZOFRAN-ODT) 4 MG disintegrating tablet  Every 8 hours PRN        05/12/23 2238             Note:  This document was prepared using Dragon voice recognition software and may include unintentional dictation errors.   Chesley Noon, MD 05/12/23 2245

## 2023-05-12 NOTE — ED Notes (Signed)
Pt placed on 2L [place] due to O2 saturation of 87% while sleeping. MD aware.

## 2023-05-12 NOTE — ED Notes (Signed)
US in progress

## 2023-05-12 NOTE — ED Provider Triage Note (Signed)
Emergency Medicine Provider Triage Evaluation Note  Jessica Santiago , a 52 y.o. female  was evaluated in triage.  Pt complains of epigastric pain with 4 episodes of vomiting. Diagnosed with gastritis last Thursday. Marland Kitchen  Physical Exam  There were no vitals taken for this visit. Gen:   Awake, no distress   Resp:  Normal effort  MSK:   Moves extremities without difficulty  Other:  Abdomen is soft.  Medical Decision Making  Medically screening exam initiated at 6:50 PM.  Appropriate orders placed.  Jessica Santiago was informed that the remainder of the evaluation will be completed by another provider, this initial triage assessment does not replace that evaluation, and the importance of remaining in the ED until their evaluation is complete.  Patient appears in significant pain and pressing in on the epigastric area while rocking back and forth due to pain. She is also hypertensive. Sent to ER bed.   Chinita Pester, FNP 05/12/23 2235

## 2023-05-12 NOTE — ED Triage Notes (Signed)
Pt arrives to triage via EMS from home w/ c/o epigastric pain since this am that she rates 10/10. Pt also endorsing 4 episodes of emesis and belching. Pt was dx with gastritis last Thursday and dcd from hospital on Friday.   213/119 100% ra 86 sinus 182 CBG  Stage 5 kidney failure and lung cancer, no chemo or radiation
